# Patient Record
Sex: Male | Born: 1940 | Race: Black or African American | Hispanic: No | Marital: Married | Smoking: Former smoker
Health system: Southern US, Community
[De-identification: ages and names within clinical notes are randomized; demographics above are authoritative.]

## PROBLEM LIST (undated history)

## (undated) DIAGNOSIS — Z8673 Personal history of transient ischemic attack (TIA), and cerebral infarction without residual deficits: Secondary | ICD-10-CM

## (undated) DIAGNOSIS — G9389 Other specified disorders of brain: Secondary | ICD-10-CM

## (undated) DIAGNOSIS — F0390 Unspecified dementia without behavioral disturbance: Secondary | ICD-10-CM

## (undated) DIAGNOSIS — G5620 Lesion of ulnar nerve, unspecified upper limb: Secondary | ICD-10-CM

## (undated) DIAGNOSIS — G40209 Localization-related (focal) (partial) symptomatic epilepsy and epileptic syndromes with complex partial seizures, not intractable, without status epilepticus: Secondary | ICD-10-CM

## (undated) DIAGNOSIS — E785 Hyperlipidemia, unspecified: Secondary | ICD-10-CM

## (undated) HISTORY — PX: KNEE SURGERY: SHX244

## (undated) HISTORY — DX: Personal history of transient ischemic attack (TIA), and cerebral infarction without residual deficits: Z86.73

## (undated) HISTORY — DX: Hyperlipidemia, unspecified: E78.5

## (undated) HISTORY — PX: OTHER SURGICAL HISTORY: SHX169

## (undated) HISTORY — DX: Lesion of ulnar nerve, unspecified upper limb: G56.20

---

## 1968-05-18 HISTORY — PX: KNEE SURGERY: SHX244

## 2008-05-01 ENCOUNTER — Ambulatory Visit: Payer: Self-pay | Admitting: Cardiology

## 2008-05-01 LAB — CONVERTED CEMR LAB
BUN: 15 mg/dL (ref 6–23)
Bilirubin Urine: NEGATIVE
CO2: 29 meq/L (ref 19–32)
Calcium: 9.9 mg/dL (ref 8.4–10.5)
Chloride: 107 meq/L (ref 96–112)
Creatinine, Ser: 1.3 mg/dL (ref 0.4–1.5)
GFR calc Af Amer: 71 mL/min
GFR calc non Af Amer: 59 mL/min
Glucose, Bld: 96 mg/dL (ref 70–99)
Hemoglobin, Urine: NEGATIVE
Ketones, ur: NEGATIVE mg/dL
Leukocytes, UA: NEGATIVE
Nitrite: NEGATIVE
Potassium: 3.9 meq/L (ref 3.5–5.1)
Sodium: 141 meq/L (ref 135–145)
Specific Gravity, Urine: 1.02 (ref 1.000–1.03)
TSH: 3.92 microintl units/mL (ref 0.35–5.50)
Urine Glucose: NEGATIVE mg/dL
Urobilinogen, UA: 0.2 (ref 0.0–1.0)
pH: 6 (ref 5.0–8.0)

## 2008-05-15 ENCOUNTER — Ambulatory Visit: Payer: Self-pay

## 2008-05-15 ENCOUNTER — Ambulatory Visit: Payer: Self-pay | Admitting: Cardiology

## 2008-05-15 ENCOUNTER — Encounter: Payer: Self-pay | Admitting: Cardiology

## 2008-06-01 ENCOUNTER — Ambulatory Visit: Payer: Self-pay | Admitting: Cardiology

## 2008-06-05 ENCOUNTER — Ambulatory Visit: Payer: Self-pay | Admitting: Cardiology

## 2008-06-22 ENCOUNTER — Ambulatory Visit: Payer: Self-pay | Admitting: Cardiology

## 2008-06-29 ENCOUNTER — Ambulatory Visit: Payer: Self-pay | Admitting: Cardiology

## 2008-06-29 LAB — CONVERTED CEMR LAB
Calcium: 9.4 mg/dL (ref 8.4–10.5)
GFR calc Af Amer: 60 mL/min
Sodium: 139 meq/L (ref 135–145)

## 2008-07-18 ENCOUNTER — Ambulatory Visit: Payer: Self-pay | Admitting: Cardiology

## 2008-07-18 LAB — CONVERTED CEMR LAB
GFR calc Af Amer: 65 mL/min
GFR calc non Af Amer: 54 mL/min
Potassium: 4.5 meq/L (ref 3.5–5.1)
Sodium: 139 meq/L (ref 135–145)

## 2008-07-20 ENCOUNTER — Ambulatory Visit: Payer: Self-pay | Admitting: Cardiology

## 2008-07-31 DIAGNOSIS — I10 Essential (primary) hypertension: Secondary | ICD-10-CM

## 2008-08-01 ENCOUNTER — Ambulatory Visit: Payer: Self-pay | Admitting: Cardiology

## 2008-08-01 ENCOUNTER — Encounter: Payer: Self-pay | Admitting: Nurse Practitioner

## 2008-08-13 ENCOUNTER — Ambulatory Visit: Payer: Self-pay | Admitting: Cardiology

## 2008-08-28 ENCOUNTER — Ambulatory Visit: Payer: Self-pay | Admitting: Cardiology

## 2008-08-28 ENCOUNTER — Encounter: Payer: Self-pay | Admitting: Cardiology

## 2008-09-19 ENCOUNTER — Ambulatory Visit: Payer: Self-pay | Admitting: Cardiology

## 2008-09-20 ENCOUNTER — Inpatient Hospital Stay (HOSPITAL_COMMUNITY): Admission: EM | Admit: 2008-09-20 | Discharge: 2008-09-22 | Payer: Self-pay | Admitting: Emergency Medicine

## 2008-09-20 ENCOUNTER — Encounter (INDEPENDENT_AMBULATORY_CARE_PROVIDER_SITE_OTHER): Payer: Self-pay | Admitting: *Deleted

## 2008-09-20 ENCOUNTER — Encounter (INDEPENDENT_AMBULATORY_CARE_PROVIDER_SITE_OTHER): Payer: Self-pay | Admitting: Internal Medicine

## 2008-09-20 ENCOUNTER — Ambulatory Visit: Payer: Self-pay | Admitting: Vascular Surgery

## 2008-09-27 ENCOUNTER — Ambulatory Visit: Payer: Self-pay | Admitting: Cardiology

## 2008-09-27 DIAGNOSIS — G459 Transient cerebral ischemic attack, unspecified: Secondary | ICD-10-CM

## 2008-10-30 ENCOUNTER — Ambulatory Visit: Payer: Self-pay | Admitting: Cardiology

## 2008-11-16 ENCOUNTER — Ambulatory Visit: Payer: Self-pay | Admitting: Internal Medicine

## 2008-11-16 DIAGNOSIS — H26499 Other secondary cataract, unspecified eye: Secondary | ICD-10-CM | POA: Insufficient documentation

## 2008-11-16 DIAGNOSIS — R7309 Other abnormal glucose: Secondary | ICD-10-CM

## 2008-11-16 DIAGNOSIS — E785 Hyperlipidemia, unspecified: Secondary | ICD-10-CM

## 2008-11-16 DIAGNOSIS — N400 Enlarged prostate without lower urinary tract symptoms: Secondary | ICD-10-CM

## 2008-11-16 LAB — CONVERTED CEMR LAB
Albumin: 4 g/dL (ref 3.5–5.2)
Alkaline Phosphatase: 45 units/L (ref 39–117)
Basophils Absolute: 0 10*3/uL (ref 0.0–0.1)
Bilirubin Urine: NEGATIVE
CO2: 27 meq/L (ref 19–32)
Calcium: 9.6 mg/dL (ref 8.4–10.5)
Chloride: 110 meq/L (ref 96–112)
Creatinine, Ser: 1.3 mg/dL (ref 0.4–1.5)
Eosinophils Absolute: 0.2 10*3/uL (ref 0.0–0.7)
Glucose, Bld: 129 mg/dL — ABNORMAL HIGH (ref 70–99)
HDL: 36.6 mg/dL — ABNORMAL LOW (ref >39.00)
Hemoglobin, Urine: NEGATIVE
Hemoglobin: 13.1 g/dL (ref 13.0–17.0)
Hgb A1c MFr Bld: 5.2 % (ref 4.6–6.5)
Ketones, ur: NEGATIVE mg/dL
Leukocytes, UA: NEGATIVE
Lymphocytes Relative: 31.1 % (ref 12.0–46.0)
MCHC: 33.6 g/dL (ref 30.0–36.0)
MCV: 91.4 fL (ref 78.0–100.0)
Magnesium: 2.1 mg/dL (ref 1.5–2.5)
Monocytes Absolute: 1 10*3/uL (ref 0.1–1.0)
Neutro Abs: 3.1 10*3/uL (ref 1.4–7.7)
PSA: 0.55 ng/mL (ref 0.10–4.00)
RDW: 12 % (ref 11.5–14.6)
Sodium: 143 meq/L (ref 135–145)
Specific Gravity, Urine: 1.02 (ref 1.000–1.030)
TSH: 2.25 microintl units/mL (ref 0.35–5.50)
Total Protein: 6.7 g/dL (ref 6.0–8.3)
Triglycerides: 75 mg/dL (ref 0.0–149.0)
Urine Glucose: NEGATIVE mg/dL
Urobilinogen, UA: 0.2 (ref 0.0–1.0)

## 2008-12-18 ENCOUNTER — Ambulatory Visit: Payer: Self-pay | Admitting: Internal Medicine

## 2008-12-18 LAB — CONVERTED CEMR LAB: HDL goal, serum: 40 mg/dL

## 2008-12-25 ENCOUNTER — Ambulatory Visit: Payer: Self-pay | Admitting: Gastroenterology

## 2009-01-08 ENCOUNTER — Encounter: Payer: Self-pay | Admitting: Gastroenterology

## 2009-01-08 ENCOUNTER — Ambulatory Visit: Payer: Self-pay | Admitting: Gastroenterology

## 2009-01-09 ENCOUNTER — Encounter: Payer: Self-pay | Admitting: Gastroenterology

## 2009-01-22 ENCOUNTER — Ambulatory Visit: Payer: Self-pay | Admitting: Cardiology

## 2009-04-18 ENCOUNTER — Ambulatory Visit: Payer: Self-pay | Admitting: Internal Medicine

## 2009-07-18 ENCOUNTER — Ambulatory Visit: Payer: Self-pay | Admitting: Internal Medicine

## 2009-07-18 ENCOUNTER — Telehealth: Payer: Self-pay | Admitting: Internal Medicine

## 2009-07-18 DIAGNOSIS — E8881 Metabolic syndrome: Secondary | ICD-10-CM

## 2009-08-14 ENCOUNTER — Ambulatory Visit: Payer: Self-pay | Admitting: Cardiology

## 2009-08-22 ENCOUNTER — Ambulatory Visit: Payer: Self-pay | Admitting: Internal Medicine

## 2009-11-15 ENCOUNTER — Ambulatory Visit: Payer: Self-pay | Admitting: Internal Medicine

## 2009-11-15 LAB — CONVERTED CEMR LAB
CO2: 25 meq/L (ref 19–32)
Calcium: 9.6 mg/dL (ref 8.4–10.5)
Chloride: 109 meq/L (ref 96–112)
Glucose, Bld: 118 mg/dL — ABNORMAL HIGH (ref 70–99)
Potassium: 4.8 meq/L (ref 3.5–5.1)
Sodium: 140 meq/L (ref 135–145)

## 2010-05-20 ENCOUNTER — Encounter: Payer: Self-pay | Admitting: Internal Medicine

## 2010-05-20 ENCOUNTER — Ambulatory Visit
Admission: RE | Admit: 2010-05-20 | Discharge: 2010-05-20 | Payer: Self-pay | Source: Home / Self Care | Attending: Internal Medicine | Admitting: Internal Medicine

## 2010-05-20 LAB — CONVERTED CEMR LAB
BUN: 24 mg/dL — ABNORMAL HIGH (ref 6–23)
Basophils Absolute: 0 10*3/uL (ref 0.0–0.1)
Bilirubin, Direct: 0.1 mg/dL (ref 0.0–0.3)
Blood, UA: NEGATIVE
Chloride: 107 meq/L (ref 96–112)
Cholesterol: 158 mg/dL (ref 0–200)
Creatinine, Ser: 1.3 mg/dL (ref 0.4–1.5)
Eosinophils Relative: 4.6 % (ref 0.0–5.0)
GFR calc non Af Amer: 70.46 mL/min (ref >60.00)
Glucose, Bld: 120 mg/dL — ABNORMAL HIGH (ref 70–99)
HCT: 39.9 % (ref 39.0–52.0)
Hgb A1c MFr Bld: 6.4 % (ref 4.6–6.5)
LDL Cholesterol: 101 mg/dL — ABNORMAL HIGH (ref 0–99)
Lymphs Abs: 2.4 10*3/uL (ref 0.7–4.0)
MCV: 93.6 fL (ref 78.0–100.0)
Monocytes Absolute: 1.1 10*3/uL — ABNORMAL HIGH (ref 0.1–1.0)
Neutrophils Relative %: 48.6 % (ref 43.0–77.0)
Nitrite: NEGATIVE
PSA: 0.77 ng/mL (ref 0.10–4.00)
Platelets: 190 10*3/uL (ref 150.0–400.0)
Potassium: 4.6 meq/L (ref 3.5–5.1)
RDW: 12.5 % (ref 11.5–14.6)
Specific Gravity, Urine: 1.015 (ref 1.000–1.030)
TSH: 2.15 microintl units/mL (ref 0.35–5.50)
Total Bilirubin: 0.9 mg/dL (ref 0.3–1.2)
Triglycerides: 129 mg/dL (ref 0.0–149.0)
Urobilinogen, UA: 0.2 (ref 0.0–1.0)
VLDL: 25.8 mg/dL (ref 0.0–40.0)
WBC: 7.5 10*3/uL (ref 4.5–10.5)

## 2010-05-21 ENCOUNTER — Telehealth: Payer: Self-pay | Admitting: Internal Medicine

## 2010-05-21 ENCOUNTER — Encounter: Payer: Self-pay | Admitting: Internal Medicine
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
## 2010-06-17 NOTE — Progress Notes (Signed)
     Follow-up for Phone Call       Follow-up by: Etta Grandchild MD,  July 18, 2009 10:40 AM    Additional Follow-up for Phone Call Additional follow up Details #2::    please sign out of his chart Follow-up by: Etta Grandchild MD,  July 18, 2009 10:40 AM
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
## 2010-06-18 ENCOUNTER — Ambulatory Visit (INDEPENDENT_AMBULATORY_CARE_PROVIDER_SITE_OTHER): Payer: Medicare Other | Admitting: Internal Medicine

## 2010-06-18 ENCOUNTER — Encounter: Payer: Self-pay | Admitting: Internal Medicine

## 2010-06-18 DIAGNOSIS — I10 Essential (primary) hypertension: Secondary | ICD-10-CM

## 2010-06-18 DIAGNOSIS — N410 Acute prostatitis: Secondary | ICD-10-CM

## 2010-06-18 DIAGNOSIS — E1165 Type 2 diabetes mellitus with hyperglycemia: Secondary | ICD-10-CM

## 2010-06-18 DIAGNOSIS — E7800 Pure hypercholesterolemia, unspecified: Secondary | ICD-10-CM

## 2010-06-18 DIAGNOSIS — E119 Type 2 diabetes mellitus without complications: Secondary | ICD-10-CM
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
## 2010-06-19 NOTE — Progress Notes (Signed)
  Phone Note Outgoing Call   Summary of Call: LA - please tell him that it looks like he has a prostate gland infection, I sent an Rx in for cipro Initial call taken by: Etta Grandchild MD,  May 21, 2010 7:24 AM  Follow-up for Phone Call        Patient wife notified and he will pick up rx and recheck in 30 days.Alvy Beal Archie CMA  May 21, 2010 8:46 AM     New/Updated Medications: CIPRO 500 MG TAB (CIPROFLOXACIN HCL) Take 1 tablet by mouth morning and night X 30 days Prescriptions: CIPRO 500 MG TAB (CIPROFLOXACIN HCL) Take 1 tablet by mouth morning and night X 30 days  #60 x 1   Entered and Authorized by:   Etta Grandchild MD   Signed by:   Etta Grandchild MD on 05/21/2010   Method used:   Electronically to        [facility] [phone]* (retail)       [address]       [place], [place]  [address]       Ph: [phone]       Fax: [phone]   RxID:   [id number]

## 2010-06-19 NOTE — Letter (Signed)
Summary: Lipid Letter  [facility]  [address] [place], [place] [address]   Phone: [phone]  Fax: [phone]    05/21/2010  Tony [name] [address] [place], [place]  [address]  Dear Tony [name]:  We have carefully reviewed your last lipid profile from 05/20/2010 and the results are noted below with a summary of recommendations for lipid management.    Cholesterol:       158     Goal: <200   HDL "good" Cholesterol:   75.64     Goal: >40   LDL "bad" Cholesterol:   101     Goal: <70   Triglycerides:       129.0     Goal: <150    your other labs look good with the exception of abnormal urine that looks like a prostate gland infection - I have sent an anitbiotic prescription with this letter    TLC Diet (Therapeutic Lifestyle Change): Saturated Fats & Transfatty acids should be kept < 7% of total calories ***Reduce Saturated Fats Polyunstaurated Fat can be up to 10% of total calories Monounsaturated Fat Fat can be up to 20% of total calories Total Fat should be no greater than 25-35% of total calories Carbohydrates should be 50-60% of total calories Protein should be approximately 15% of total calories Fiber should be at least 20-30 grams a day ***Increased fiber may help lower LDL Total Cholesterol should be < 200mg /day Consider adding plant stanol/sterols to diet (example: Benacol spread) ***A higher intake of unsaturated fat may reduce Triglycerides and Increase HDL    Adjunctive Measures (may lower LIPIDS and reduce risk of Heart Attack) include: Aerobic Exercise (20-30 minutes 3-4 times a week) Limit Alcohol Consumption Weight Reduction Aspirin 75-81 mg a day by mouth (if not allergic or contraindicated) Dietary Fiber 20-30 grams a day by mouth     Current Medications: 1)    Metoprolol Succinate 100 Mg Xr24h-tab (Metoprolol succinate) .... One by mouth daily 2)    Spironolactone 50 Mg Tabs (Spironolactone) .... One by mouth daily 3)    Aspirin  325 Mg Tabs (Aspirin) .... One by mouth daily 4)    Multivitamins   Tabs (Multiple vitamin) .... Daily 5)    Zocor 10 Mg Tabs (Simvastatin) .... Daily 6)    Valturna 300-320 Mg Tabs (Aliskiren-valsartan) .... One by mouth once daily for high blood pressure  If you have any questions, please call. We appreciate being able to work with you.   Sincerely,    [facility] Etta Grandchild MD
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
## 2010-07-15 ENCOUNTER — Encounter: Payer: Self-pay | Admitting: Internal Medicine

## 2010-07-28 ENCOUNTER — Telehealth: Payer: Self-pay | Admitting: Internal Medicine

## 2010-08-05 NOTE — Progress Notes (Signed)
     Diabetes Management Exam:    Eye Exam:       Eye Exam done elsewhere          Date: 07/15/2010          Results: normal          Done by: Clarisa Kindred

## 2010-08-05 NOTE — Consult Note (Signed)
Summary: [facility]   Imported By: Sherian Rein 07/30/2010 12:50:55  _____________________________________________________________________  External Attachment:    Type:   Image     Comment:   External Document

## 2010-08-21 ENCOUNTER — Encounter: Payer: Self-pay | Admitting: Internal Medicine

## 2010-08-21 ENCOUNTER — Ambulatory Visit (INDEPENDENT_AMBULATORY_CARE_PROVIDER_SITE_OTHER): Payer: Medicare Other | Admitting: Internal Medicine

## 2010-08-21 ENCOUNTER — Telehealth: Payer: Self-pay | Admitting: *Deleted

## 2010-08-21 VITALS — BP 128/70 | HR 56 | Temp 98.5°F | Ht 69.0 in | Wt 193.0 lb

## 2010-08-21 DIAGNOSIS — K648 Other hemorrhoids: Secondary | ICD-10-CM | POA: Insufficient documentation

## 2010-08-21 DIAGNOSIS — I10 Essential (primary) hypertension: Secondary | ICD-10-CM

## 2010-08-21 MED ORDER — HYDROCORTISONE ACE-PRAMOXINE 1-1 % RE FOAM: 1.0000 | Freq: Two times a day (BID) | RECTAL | Status: AC

## 2010-08-21 NOTE — Progress Notes (Signed)
  Subjective:    Patient ID: Tony [name], male    DOB: Jul 25, 1940, 70 y.o.   MRN: [id number]  HPI He returns c/o several weeks of anal pain and itching but no bleeding.   Review of Systems  Constitutional: Negative for fever, chills, diaphoresis, activity change, appetite change, fatigue and unexpected weight change.  Respiratory: Negative for cough, shortness of breath, wheezing and stridor.   Cardiovascular: Negative for chest pain, palpitations and leg swelling.  Gastrointestinal: Negative for nausea, vomiting, abdominal pain, diarrhea, constipation, blood in stool, abdominal distention and anal bleeding.  Genitourinary: Negative for dysuria, urgency, frequency, hematuria, flank pain, decreased urine volume, discharge, penile swelling, scrotal swelling, difficulty urinating, genital sores, penile pain and testicular pain.  Musculoskeletal: Negative for myalgias, back pain, joint swelling, arthralgias and gait problem.  Neurological: Negative for dizziness, numbness and headaches.  Hematological: Negative for adenopathy. Does not bruise/bleed easily.  Psychiatric/Behavioral: Negative for behavioral problems, confusion and agitation.       Lab Results  Component Value Date   WBC 7.5 05/20/2010   HGB 13.4 05/20/2010   HCT 39.9 05/20/2010   PLT 190.0 05/20/2010   CHOL 158 05/20/2010   TRIG 129.0 05/20/2010   HDL 31.10* 05/20/2010   ALT 32 05/20/2010   AST 26 05/20/2010   NA 138 05/20/2010   K 4.6 05/20/2010   CL 107 05/20/2010   CREATININE 1.3 05/20/2010   BUN 24* 05/20/2010   CO2 22 05/20/2010   TSH 2.15 05/20/2010   PSA 0.77 05/20/2010   HGBA1C 6.4 05/20/2010   Objective:   Physical Exam  Constitutional: He appears well-developed and well-nourished. No distress.  HENT:  Head: Normocephalic and atraumatic.  Right Ear: External ear normal.  Left Ear: External ear normal.  Nose: Nose normal.  Mouth/Throat: Oropharynx is clear and moist. No oropharyngeal exudate.  Eyes: Conjunctivae and EOM are normal.  Pupils are equal, round, and reactive to light. Right eye exhibits no discharge. Left eye exhibits no discharge. No scleral icterus.  Neck: Normal range of motion. Neck supple. No thyromegaly present.  Cardiovascular: Normal rate, regular rhythm, normal heart sounds and intact distal pulses.  Exam reveals no gallop and no friction rub.   No murmur heard. Pulmonary/Chest: Effort normal and breath sounds normal. No respiratory distress. He has no wheezes. He has no rales. He exhibits no tenderness.  Abdominal: Soft. Bowel sounds are normal. He exhibits no distension and no mass. There is no tenderness. There is no rebound and no guarding.  Genitourinary: Prostate normal and penis normal. Rectal exam shows internal hemorrhoid. Rectal exam shows no external hemorrhoid, no fissure, no mass, no tenderness and anal tone normal. Guaiac negative stool. No penile tenderness.  Musculoskeletal: Normal range of motion. He exhibits no edema and no tenderness.  Lymphadenopathy:    He has no cervical adenopathy.  Neurological: He is alert.  Skin: Skin is warm and dry. No rash noted. He is not diaphoretic. No erythema. No pallor.  Psychiatric: He has a normal mood and affect. His behavior is normal. Judgment and thought content normal.          Assessment & Plan:

## 2010-08-21 NOTE — Assessment & Plan Note (Signed)
Start proctofoam and gave pt ed material as well

## 2010-08-21 NOTE — Telephone Encounter (Signed)
Pharm called req alt to to proctofoam. Med is not covered by INS and cost to pt is >80 dollars. Please send new rx to pharm

## 2010-08-21 NOTE — Assessment & Plan Note (Signed)
BP is well controlled, continue same

## 2010-08-26 LAB — CBC
HCT: 37.2 % — ABNORMAL LOW (ref 39.0–52.0)
HCT: 37.4 % — ABNORMAL LOW (ref 39.0–52.0)
MCHC: 34 g/dL (ref 30.0–36.0)
MCV: 91.1 fL (ref 78.0–100.0)
MCV: 92.3 fL (ref 78.0–100.0)
Platelets: 211 10*3/uL (ref 150–400)
Platelets: 212 10*3/uL (ref 150–400)
RDW: 13.2 % (ref 11.5–15.5)
RDW: 13.4 % (ref 11.5–15.5)
WBC: 8.9 10*3/uL (ref 4.0–10.5)

## 2010-08-26 LAB — COMPREHENSIVE METABOLIC PANEL
Albumin: 3.7 g/dL (ref 3.5–5.2)
Albumin: 3.9 g/dL (ref 3.5–5.2)
BUN: 21 mg/dL (ref 6–23)
BUN: 23 mg/dL (ref 6–23)
Calcium: 9.2 mg/dL (ref 8.4–10.5)
Calcium: 9.6 mg/dL (ref 8.4–10.5)
Chloride: 110 mEq/L (ref 96–112)
Creatinine, Ser: 1.44 mg/dL (ref 0.4–1.5)
Creatinine, Ser: 1.7 mg/dL — ABNORMAL HIGH (ref 0.4–1.5)
Total Bilirubin: 0.7 mg/dL (ref 0.3–1.2)
Total Protein: 6.3 g/dL (ref 6.0–8.3)

## 2010-08-26 LAB — DIFFERENTIAL
Basophils Absolute: 0 10*3/uL (ref 0.0–0.1)
Basophils Absolute: 0 10*3/uL (ref 0.0–0.1)
Basophils Relative: 0 % (ref 0–1)
Eosinophils Absolute: 0.1 10*3/uL (ref 0.0–0.7)
Eosinophils Relative: 1 % (ref 0–5)
Lymphocytes Relative: 17 % (ref 12–46)
Lymphocytes Relative: 19 % (ref 12–46)
Lymphs Abs: 1.5 10*3/uL (ref 0.7–4.0)
Lymphs Abs: 1.8 10*3/uL (ref 0.7–4.0)
Monocytes Absolute: 1.2 10*3/uL — ABNORMAL HIGH (ref 0.1–1.0)
Monocytes Absolute: 1.2 10*3/uL — ABNORMAL HIGH (ref 0.1–1.0)
Monocytes Relative: 13 % — ABNORMAL HIGH (ref 3–12)
Neutro Abs: 6 10*3/uL (ref 1.7–7.7)
Neutro Abs: 6.3 10*3/uL (ref 1.7–7.7)
Neutrophils Relative %: 67 % (ref 43–77)

## 2010-08-26 LAB — BASIC METABOLIC PANEL
BUN: 17 mg/dL (ref 6–23)
Chloride: 112 mEq/L (ref 96–112)
Creatinine, Ser: 1.28 mg/dL (ref 0.4–1.5)
Glucose, Bld: 116 mg/dL — ABNORMAL HIGH (ref 70–99)
Potassium: 4.2 mEq/L (ref 3.5–5.1)

## 2010-08-26 LAB — LIPID PANEL
LDL Cholesterol: 134 mg/dL — ABNORMAL HIGH (ref 0–99)
Total CHOL/HDL Ratio: 5.4 RATIO
Triglycerides: 118 mg/dL (ref <150)
VLDL: 24 mg/dL (ref 0–40)

## 2010-08-26 LAB — APTT: aPTT: 32 seconds (ref 24–37)

## 2010-08-26 LAB — PHOSPHORUS: Phosphorus: 3.5 mg/dL (ref 2.3–4.6)

## 2010-08-26 LAB — PROTIME-INR
INR: 1.2 (ref 0.00–1.49)
Prothrombin Time: 15.4 seconds — ABNORMAL HIGH (ref 11.6–15.2)

## 2010-08-26 LAB — CREATININE, URINE, RANDOM: Creatinine, Urine: 82.3 mg/dL

## 2010-08-26 LAB — HEMOGLOBIN A1C: Hgb A1c MFr Bld: 5.4 % (ref 4.6–6.1)

## 2010-08-26 LAB — MAGNESIUM: Magnesium: 2.3 mg/dL (ref 1.5–2.5)

## 2010-08-26 LAB — POCT CARDIAC MARKERS

## 2010-08-27 MED ORDER — HYDROCORTISONE 2.5 % RE CREA: TOPICAL_CREAM | Freq: Two times a day (BID) | RECTAL | Status: AC

## 2010-08-27 NOTE — Telephone Encounter (Signed)
done

## 2010-09-14 ENCOUNTER — Other Ambulatory Visit: Payer: Self-pay | Admitting: Cardiology

## 2010-09-18 ENCOUNTER — Ambulatory Visit: Payer: Medicare Other | Admitting: Cardiology

## 2010-09-19 ENCOUNTER — Observation Stay (HOSPITAL_COMMUNITY)
Admission: EM | Admit: 2010-09-19 | Discharge: 2010-09-21 | Disposition: A | Discharge status: Home / Self Care | Payer: Medicare Other | Source: Ambulatory Visit | Attending: Internal Medicine | Admitting: Internal Medicine

## 2010-09-19 DIAGNOSIS — E669 Obesity, unspecified: Secondary | ICD-10-CM | POA: Insufficient documentation

## 2010-09-19 DIAGNOSIS — Z79899 Other long term (current) drug therapy: Secondary | ICD-10-CM | POA: Insufficient documentation

## 2010-09-19 DIAGNOSIS — I498 Other specified cardiac arrhythmias: Secondary | ICD-10-CM | POA: Insufficient documentation

## 2010-09-19 DIAGNOSIS — I119 Hypertensive heart disease without heart failure: Principal | ICD-10-CM | POA: Insufficient documentation

## 2010-09-19 DIAGNOSIS — Z8673 Personal history of transient ischemic attack (TIA), and cerebral infarction without residual deficits: Secondary | ICD-10-CM | POA: Insufficient documentation

## 2010-09-19 DIAGNOSIS — E860 Dehydration: Secondary | ICD-10-CM | POA: Insufficient documentation

## 2010-09-19 DIAGNOSIS — N289 Disorder of kidney and ureter, unspecified: Secondary | ICD-10-CM | POA: Insufficient documentation

## 2010-09-19 DIAGNOSIS — E785 Hyperlipidemia, unspecified: Secondary | ICD-10-CM | POA: Insufficient documentation

## 2010-09-19 LAB — POCT I-STAT, CHEM 8
BUN: 41 mg/dL — ABNORMAL HIGH (ref 6–23)
Calcium, Ion: 1.17 mmol/L (ref 1.12–1.32)
Creatinine, Ser: 3.3 mg/dL — ABNORMAL HIGH (ref 0.4–1.5)
Glucose, Bld: 100 mg/dL — ABNORMAL HIGH (ref 70–99)
TCO2: 17 mmol/L (ref 0–100)

## 2010-09-19 LAB — ETHANOL: Alcohol, Ethyl (B): <11 mg/dL — ABNORMAL HIGH (ref 0–10)

## 2010-09-20 LAB — CARDIAC PANEL(CRET KIN+CKTOT+MB+TROPI)
CK, MB: 7.1 ng/mL (ref 0.3–4.0)
Relative Index: 0.8 (ref 0.0–2.5)
Relative Index: 0.8 (ref 0.0–2.5)
Total CK: 885 U/L — ABNORMAL HIGH (ref 7–232)
Troponin I: <0.3 ng/mL (ref <0.30)

## 2010-09-21 LAB — BASIC METABOLIC PANEL
CO2: 21 mEq/L (ref 19–32)
Chloride: 112 mEq/L (ref 96–112)
GFR calc non Af Amer: >60 mL/min (ref >60)
Glucose, Bld: 135 mg/dL — ABNORMAL HIGH (ref 70–99)
Potassium: 4.1 mEq/L (ref 3.5–5.1)
Sodium: 139 mEq/L (ref 135–145)

## 2010-09-22 NOTE — H&P (Signed)
NAMEMARCO, [name] NO.:  [id number]  MEDICAL RECORD NO.:  [id number]           PATIENT TYPE:  E  LOCATION:  [facility]                         FACILITY:  [facility]  PHYSICIAN:  Houston Siren, MD           DATE OF BIRTH:  05/06/1941  DATE OF ADMISSION:  09/19/2010 DATE OF DISCHARGE:                             HISTORY & PHYSICAL   PRIMARY CARE PHYSICIAN:  None.  CARDIOLOGIST:  Rollene Rotunda, MD, [facility]  REASON FOR ADMISSION:  Syncope.  ADVANCED DIRECTIVE:  Full code.  HISTORY OF PRESENT ILLNESS:  This is a 70 year old male with history of hypertension on beta-blocker, valsartan, Norvasc, and diuretics, prior CVA, presented to the emergency room with a brief presyncopal episode.  His wife saw that he stood up and passed out.  He stated he has some orthostatic symptomatology prior to this.  He denied any chest pain, shortness of breath, headache, nausea, vomiting, or diaphoresis.  He has had no fever or chills, black stool, or bloody stool.  Apparently, he has not eaten anything all day today.  He had an episode prior with syncopal episode and was thought to be secondary to bradycardia at that time.  Evaluation in the emergency room showed elevated CPK of 1222. His troponin was less than 0.3, creatinine of 3.0, potassium of 5.3. His alcohol level is less than 11.  EKG showed normal sinus rhythm without any acute ST-T changes.  Hospitalist was asked to admit the patient because of syncopal episode.  PAST MEDICAL HISTORY:  As above.  ALLERGIES:  No known drug allergies.  CURRENT MEDICATIONS: 1. Lumigan eye drop. 2. Exforge 5/320 1 per day. 3. Simvastatin 40 mg per day. 4. Multivitamin. 5. Aspirin 325 mg per day. 6. Spirolactone 50 mg daily. 7. Lopressor 100 mg per day. 8. Systane eyedrops.  REVIEW OF SYSTEMS:  Otherwise unremarkable.  SOCIAL HISTORY:  He is married, lives with his spouse, not a smoker and only an occasional drinker.  PHYSICAL EXAMINATION:   VITAL SIGNS:  Blood pressure 110-120/60, pulse of 76, respiratory rate of 18, temperature 98.5. GENERAL:  He is alert and oriented, and he in no apparent distress. HEENT:  He has facial asymmetry and fluent speech.  Tongue is midline. Uvula elevated with phonation.  No carotid bruit. CARDIAC:  S1 and S2 regular.  I did not hear murmur, rub, or gallop. LUNGS:  Clear.  No wheezes, rales, or any evidence of consolidation. ABDOMEN:  Soft, nondistended, nontender.  No palpable mass. EXTREMITIES:  No edema.  Babinski on flexion.  Strength equal bilaterally. NEUROLOGIC/PSYCHIATRIC:  Normal.  OBJECTIVE FINDINGS:  Troponin less than 0.3, alcohol less than 11.  CPK of 1222, potassium of 5.3, BUN of 41, creatinine of 3.0, serum sodium of 138.  IMPRESSION:  This is a 70 year old male with prior severe labile hypertension on beta-blocker and several other antihypertensive medications presenting with a syncopal episode.  I suspect that he was orthostatic from dehydration and having bradycardia as well.  We will admit to monitor his rhythm.  Rule out with serial CPKs and troponins.  I would like to  discontinue his spinorolactone and hold his antihypertensive medications including the beta blocker.  He is a full code.  He will be [facility] to [facility].  Undoubtedly, his blood pressure will go up, and his blood pressure medication will need to be resumed as appropriate.  He is stable, a full code.     Houston Siren, MD     PL/MEDQ  D:  09/20/2010  T:  09/20/2010  Job:  [id number]  Electronically Signed by Houston Siren  on 09/22/2010 04:41:55 AM

## 2010-09-23 ENCOUNTER — Encounter: Payer: Self-pay | Admitting: Internal Medicine

## 2010-09-24 NOTE — Discharge Summary (Signed)
Tony [name], Tony [name]               ACCOUNT NO.:  [id number]  MEDICAL RECORD NO.:  [id number]           PATIENT TYPE:  LOCATION:                                 FACILITY:  PHYSICIAN:  Pleas Koch, MD        DATE OF BIRTH:  08/14/1940  DATE OF ADMISSION:  09/20/2010 DATE OF DISCHARGE:  09/21/2010                              DISCHARGE SUMMARY   DISCHARGE DIAGNOSES: 1. Labile hypertension, on multiple medications. 2. Orthostasis from dehydration as well as some mild bradycardia. 3. Hyperlipidemia. 4. History of transient ischemic attack in the past. 5. Mild obesity.  DISCHARGE MEDICATIONS: 1. Exforge 5/320 1 tablet daily. 2. Metoprolol XL succinate, please note dosage changed from 100 mg     daily to 25 mg as extended release, 30 tablets prescribed. 3. Simvastatin 40 mg daily. 4. Spironolactone, please note dosage changed from 50 mg once daily to     25 mg once daily, 30 tablets given. 5. Multivitamins over-the-counter. 6. Aspirin 325 daily. 7. Lumigan 0.03% at bedtime. 8. Systane eye drops 1 drop b.i.d. p.r.n.  HOSPITAL COURSE:  Briefly, this is a 70 year old male who has a history of hypertension, on multiple diuretics; prior CVA, who presented with brief syncopal episode.  His wife told that he stood up and then passed out.  He stated he had some orthostatic symptomatology.  Denied any chest pain, shortness of breath, headache, nausea, vomiting, weakness. No fevers, chills, black stool, bloody stools.  Not eaten anything all day and was working in the yard, mowing the lawn and had not drank anything either.  On admission, his creatinine was 3.0 and his pulse was around 76.  He had good facial symmetry.  No carotid bruit.  No murmurs, rubs, or gallops.  Equal strength bilaterally.  Blood pressure well controlled.  DISCHARGE DIAGNOSES: 1. Relative orthostasis, likely secondary to multiple diuretics as     well as volume depletion from not taking p.o. - the patient was   held off his multiple antihypertensive medications.  It was noted     that his blood pressure then rebounded upwards.  As such, we have     reimplemented his metoprolol and spironolactone with the dosages as     dictated above.  The patient should follow with his primary care     physician in 4-5 days to determine best course of action.  It is     unclear why he is on specifically spironolactone and he does not     have any history of heart failure at this time and this may be     useful to consider on discharge home. 2. Acute renal insufficiency.  Initial creatinine was 3.  It was noted     that his creatinine on day of discharge dropped to 1.16     BUN was also 41   I     would attribute it to be prerenal azotemia.  The patient will need     a BMP in about 1-2 days and to check for both this and his     potassium as he is on  spironolactone.  He is also on valsartan,     which relatively is contraindicated, but as his renal function is     improving, we will continue the same. 3. Hyperlipidemia.  The patient is to continue statin. 4. Prior transient ischemic attack.  The patient had no noted symptoms     of this complain and will continue on aspirin 325 daily.  The patient was discharged home in stable state.  PHYSICAL EXAMINATION:  VITAL SIGNS:  On day of discharge, temperature 97.4, pulse was 59, respirations were 19, blood pressure was 143/77 to 126/65, and satting 99% on room air. CHEST:  Clinically clear. ABDOMEN:  Soft, nontender, and nondistended. CARDIOVASCULAR:  S1 and S2.  No noted murmurs, rubs, or gallops.  The patient will need to follow up with his primary care physician as well.  We will follow up with him in 4-5 days.          ______________________________ Pleas Koch, MD     JS/MEDQ  D:  09/21/2010  T:  09/21/2010  Job:  [id number]  cc:   Rollene Rotunda, MD, [facility] Sanda Linger, MD  Electronically Signed by Pleas Koch MD on 09/24/2010 08:17:15 PM

## 2010-09-25 ENCOUNTER — Other Ambulatory Visit: Payer: Self-pay | Admitting: Cardiology

## 2010-09-25 ENCOUNTER — Ambulatory Visit (INDEPENDENT_AMBULATORY_CARE_PROVIDER_SITE_OTHER): Payer: Medicare Other | Admitting: Internal Medicine

## 2010-09-25 ENCOUNTER — Encounter: Payer: Self-pay | Admitting: Internal Medicine

## 2010-09-25 VITALS — BP 130/74 | HR 60 | Temp 98.8°F | Resp 16 | Wt 190.0 lb

## 2010-09-25 DIAGNOSIS — I10 Essential (primary) hypertension: Secondary | ICD-10-CM

## 2010-09-25 MED ORDER — AMLODIPINE BESYLATE-VALSARTAN 5-320 MG PO TABS: 1.0000 | ORAL_TABLET | Freq: Every day | ORAL | Status: DC

## 2010-09-25 NOTE — Assessment & Plan Note (Signed)
His BP is well controlled, his last set of lytes and renal function was normal so I did not repeat them today

## 2010-09-25 NOTE — Progress Notes (Signed)
Subjective:    Patient ID: Tony [name], male    DOB: 08/21/1940, 70 y.o.   MRN: [id number]  Hypertension This is a chronic problem. The current episode started more than 1 year ago. The problem has been gradually improving since onset. The problem is controlled. Pertinent negatives include no anxiety, blurred vision, chest pain, headaches, malaise/fatigue, neck pain, orthopnea, palpitations, peripheral edema, PND, shortness of breath or sweats. There are no associated agents to hypertension. Past treatments include calcium channel blockers, beta blockers, angiotensin blockers and diuretics. The current treatment provides significant improvement. Compliance problems include medication side effects.       Review of Systems  Constitutional: Negative for fever, chills, malaise/fatigue, diaphoresis, activity change, appetite change, fatigue and unexpected weight change.  HENT: Negative for sore throat, facial swelling, trouble swallowing, neck pain, neck stiffness and voice change.   Eyes: Negative for blurred vision.  Respiratory: Negative for apnea, cough, choking, chest tightness, shortness of breath, wheezing and stridor.   Cardiovascular: Negative for chest pain, palpitations, orthopnea, leg swelling and PND.  Gastrointestinal: Negative for nausea, vomiting, abdominal pain, diarrhea, constipation, blood in stool, abdominal distention and anal bleeding.  Genitourinary: Negative for dysuria, urgency, frequency, hematuria, decreased urine volume, enuresis and difficulty urinating.  Musculoskeletal: Negative for myalgias, back pain, joint swelling, arthralgias and gait problem.  Skin: Negative for color change, pallor and rash.  Neurological: Positive for syncope (he got overheated one week ago and passed out and was in the hosp. for 2 days, he was a lillte dehydrrated and his meds were adjusted). Negative for dizziness, tremors, seizures, facial asymmetry, speech difficulty, weakness,  light-headedness, numbness and headaches.  Hematological: Negative for adenopathy. Does not bruise/bleed easily.  Psychiatric/Behavioral: Negative for suicidal ideas, hallucinations, behavioral problems, confusion, sleep disturbance, self-injury, dysphoric mood, decreased concentration and agitation. The patient is not nervous/anxious and is not hyperactive.        Objective:   Physical Exam  Vitals reviewed. Constitutional: He is oriented to person, place, and time. He appears well-developed and well-nourished. No distress.  HENT:  Head: Normocephalic and atraumatic.  Right Ear: External ear normal.  Left Ear: External ear normal.  Nose: Nose normal.  Mouth/Throat: Oropharynx is clear and moist. No oropharyngeal exudate.  Eyes: Conjunctivae and EOM are normal. Pupils are equal, round, and reactive to light. Right eye exhibits no discharge. Left eye exhibits no discharge. No scleral icterus.  Neck: Normal range of motion. Neck supple. No JVD present. No tracheal deviation present. No thyromegaly present.  Cardiovascular: Normal rate, regular rhythm, normal heart sounds and intact distal pulses.  Exam reveals no gallop and no friction rub.   No murmur heard. Pulmonary/Chest: Effort normal and breath sounds normal. No stridor. No respiratory distress. He has no wheezes. He has no rales. He exhibits no tenderness.  Abdominal: Soft. Bowel sounds are normal. He exhibits no distension and no mass. There is no tenderness. There is no rebound and no guarding.  Musculoskeletal: Normal range of motion. He exhibits no edema and no tenderness.  Lymphadenopathy:    He has no cervical adenopathy.  Neurological: He is alert and oriented to person, place, and time. He has normal reflexes. He displays normal reflexes. No cranial nerve deficit. He exhibits normal muscle tone. Coordination normal.  Skin: Skin is warm and dry. No rash noted. He is not diaphoretic. No erythema. No pallor.  Psychiatric: He has  a normal mood and affect. His behavior is normal. Judgment and thought content normal.  Lab Results  Component Value Date   WBC 7.5 05/20/2010   HGB 14.3 09/19/2010   HCT 42.0 09/19/2010   PLT 190.0 05/20/2010   CHOL 158 05/20/2010   TRIG 129.0 05/20/2010   HDL 31.10* 05/20/2010   ALT 32 05/20/2010   AST 26 05/20/2010   NA 139 09/21/2010   K 4.1 09/21/2010   CL 112 09/21/2010   CREATININE 1.16 DELTA CHECK NOTED 09/21/2010   BUN 18 09/21/2010   CO2 21 09/21/2010   TSH 1.319 09/20/2010   PSA 0.77 05/20/2010   INR 1.2 09/20/2008   HGBA1C 6.4 05/20/2010    Assessment & Plan:

## 2010-09-25 NOTE — Patient Instructions (Signed)
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
## 2010-09-30 NOTE — Procedures (Signed)
REFERRING PHYSICIAN:  Dr. Allena Katz.   CLINICAL HISTORY:  This is a portable EEG done with photic stimulation  and hyperventilation.  The patient is described as awake and alert.  This is a 70 year old man admitted after an episode of transient  alteration of consciousness.  EEG is for evaluation.   DESCRIPTION:  The dominant rhythm in this tracing is a moderate  amplitude alpha rhythm of 10-11 Hz.  A moderate to high amplitude alpha  rhythm of 10-11 Hz which dominates posteriorly, appears without abnormal  asymmetry, and attenuates with eye opening and closing.  Low amplitude  fast activity is seen frontally and centrally and appears without  abnormal asymmetry.  No focal slowing is noted and no epileptiform  discharges seen.  The patient remained in the awake state throughout the  recording.  Photic stimulation produced symmetric driving responses.  Hyperventilation produced no significant change in the background  rhythms.  Single channel devoted EKG revealed sinus bradycardia  throughout with a rate of approximately 54 beats per minute.   CONCLUSIONS:  Normal study in awake state.  Incidental note is made of  sinus bradycardia, the finding which may be relevant in the patient who  presents with a possible syncopal episode.      Michael L. Thad Ranger, M.D.  Electronically Signed     ZOX:[id number]  D:  09/21/2008 22:14:00  T:  09/22/2008 07:05:42  Job #:  [id number]
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
## 2010-10-01 ENCOUNTER — Telehealth: Payer: Self-pay | Admitting: *Deleted

## 2010-10-01 DIAGNOSIS — I10 Essential (primary) hypertension: Secondary | ICD-10-CM
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
## 2010-10-05 MED ORDER — AMLODIPINE BESYLATE 5 MG PO TABS: 5.0000 mg | ORAL_TABLET | Freq: Every day | ORAL | Status: DC

## 2010-10-05 MED ORDER — IRBESARTAN 300 MG PO TABS: 300.0000 mg | ORAL_TABLET | Freq: Every day | ORAL | Status: DC

## 2010-10-05 NOTE — Telephone Encounter (Signed)
done

## 2010-10-06 ENCOUNTER — Encounter: Payer: Self-pay | Admitting: Cardiology

## 2010-10-06 ENCOUNTER — Ambulatory Visit (INDEPENDENT_AMBULATORY_CARE_PROVIDER_SITE_OTHER): Payer: Medicare Other | Admitting: Cardiology

## 2010-10-06 DIAGNOSIS — E7800 Pure hypercholesterolemia, unspecified: Secondary | ICD-10-CM

## 2010-10-06 DIAGNOSIS — I10 Essential (primary) hypertension: Secondary | ICD-10-CM

## 2010-10-06 DIAGNOSIS — R55 Syncope and collapse: Secondary | ICD-10-CM | POA: Insufficient documentation

## 2010-10-06 MED ORDER — LOSARTAN POTASSIUM 100 MG PO TABS: 100.0000 mg | ORAL_TABLET | Freq: Every day | ORAL | Status: DC

## 2010-10-06 MED ORDER — IRBESARTAN 300 MG PO TABS: 300.0000 mg | ORAL_TABLET | Freq: Every day | ORAL | Status: DC

## 2010-10-06 MED ORDER — AMLODIPINE BESYLATE 5 MG PO TABS: 5.0000 mg | ORAL_TABLET | Freq: Every day | ORAL | Status: DC

## 2010-10-06 NOTE — Assessment & Plan Note (Signed)
The LDL in Jan was 101 with HDL of 31.  He will continue the medications as listed.

## 2010-10-06 NOTE — Patient Instructions (Signed)
Stop Exforge Start Cozaar 100 mg daily Continue all other medications as ordered See Dr Antoine Poche in 1 year

## 2010-10-06 NOTE — Assessment & Plan Note (Signed)
For cost reasons I will be stopping his Exforge and starting amlodipine 5 mg and Cozaar 100 mg.  They will keep an eye on her blood pressure and let me know if it is well-controlled.

## 2010-10-06 NOTE — Assessment & Plan Note (Signed)
He has had no further syncope and seems to be tolerating reduced dose of beta blocker and spironolactone which were changed in the hospital.  Meds will be changed as listed above.

## 2010-10-06 NOTE — Progress Notes (Signed)
HPI The patient presents with a one-year followup. He has been doing well aside from a hospitalization earlier this month with syncope felt to be related to low blood pressures. I reviewed these hospital records. His metoprolol was reduced. His amlodipine was reduced. His blood pressures have been well controlled at home. He is having no new symptoms. He denies any chest pressure, neck or arm discomfort. He has had no further syncope or presyncope. He has had no palpitations. There has been no shortness of breath, PND or orthopnea.  Allergies  Allergen Reactions  . Benazepril Cough  . Minoxidil     REACTION: wheezes    Current Outpatient Prescriptions  Medication Sig Dispense Refill  . amLODipine-valsartan (EXFORGE) 5-320 MG per tablet Take 1 tablet by mouth daily.        Marland Kitchen aspirin 325 MG tablet Take 325 mg by mouth daily.        . bimatoprost (LUMIGAN) 0.03 % ophthalmic solution Place 1 drop into both eyes at bedtime.        . metoprolol succinate (TOPROL-XL) 25 MG 24 hr tablet Take 25 mg by mouth daily.        . Multiple Vitamin (MULTIVITAMIN) tablet Take 1 tablet by mouth daily.        [name] Glycol-Propyl Glycol (SYSTANE) 0.4-0.3 % SOLN Apply 1 drop to eye 2 (two) times daily.        . simvastatin (ZOCOR) 10 MG tablet Take 10 mg by mouth at bedtime.        Marland Kitchen spironolactone (ALDACTONE) 25 MG tablet Take 25 mg by mouth daily.        Marland Kitchen DISCONTD: amLODipine (NORVASC) 5 MG tablet Take 1 tablet (5 mg total) by mouth daily.  30 tablet  11  . DISCONTD: ciprofloxacin (CIPRO) 500 MG tablet Take 500 mg by mouth 2 (two) times daily. Take 1 tablet by mouth morning and night x 30 days       . DISCONTD: irbesartan (AVAPRO) 300 MG tablet Take 1 tablet (300 mg total) by mouth daily.  30 tablet  11  . DISCONTD: metoprolol (TOPROL-XL) 100 MG 24 hr tablet take 1 tablet by mouth once daily  30 tablet  11  . DISCONTD: spironolactone (ALDACTONE) 50 MG tablet TAKE 1 TABLET BY MOUTH EVERY MORNING  30 tablet   6    Past Medical History  Diagnosis Date  . Essential hypertension     greater than 8 years  . Elevated blood sugar     Recent nonfasting blood sugars with trace of protein in his urine  . History of transient ischemic attack (TIA)   . Diabetes mellitus   . Hyperlipidemia     Past Surgical History  Procedure Date  . Knee surgery     Left knee, as a child  . Knee surgery 1970    ROS:  As stated in the HPI and negative for all other systems.  PHYSICAL EXAM BP 148/80  Pulse 72  Resp 18  Ht 5\' 9"  (1.753 m)  Wt 195 lb 12.8 oz (88.814 kg)  BMI 28.91 kg/m2 GENERAL:  Well appearing HEENT:  Pupils equal round and reactive, fundi not visualized, oral mucosa unremarkable NECK:  No jugular venous distention, waveform within normal limits, carotid upstroke brisk and symmetric, no bruits, no thyromegaly LYMPHATICS:  No cervical, inguinal adenopathy LUNGS:  Clear to auscultation bilaterally BACK:  No CVA tenderness CHEST:  Unremarkable HEART:  PMI not displaced or sustained,S1 and S2 within normal  limits, no S3, no S4, no clicks, no rubs, no murmurs ABD:  Flat, positive bowel sounds normal in frequency in pitch, no bruits, no rebound, no guarding, no midline pulsatile mass, no hepatomegaly, no splenomegaly EXT:  2 plus pulses throughout, no edema, no cyanosis no clubbing SKIN:  No rashes no nodules NEURO:  Cranial nerves II through XII grossly intact, motor grossly intact throughout [facility]:  Cognitively intact, oriented to person place and time   EKG:  (09/19/10)  Sinus rhythm, rate 69, axis within normal limits, intervals within normal limits, no acute ST-T wave changes.   ASSESSMENT AND PLAN

## 2010-10-06 NOTE — Telephone Encounter (Signed)
Rx[s] Done. LMOM to inform Pt. Of new prescriptions.

## 2010-10-22 ENCOUNTER — Encounter: Payer: Self-pay | Admitting: Internal Medicine

## 2010-10-23 ENCOUNTER — Encounter: Payer: Self-pay | Admitting: Internal Medicine

## 2010-10-23 ENCOUNTER — Ambulatory Visit (INDEPENDENT_AMBULATORY_CARE_PROVIDER_SITE_OTHER): Payer: Medicare Other | Admitting: Internal Medicine

## 2010-10-23 ENCOUNTER — Other Ambulatory Visit (INDEPENDENT_AMBULATORY_CARE_PROVIDER_SITE_OTHER): Payer: Medicare Other

## 2010-10-23 VITALS — BP 126/84 | HR 61 | Temp 98.6°F | Resp 16 | Wt 171.0 lb

## 2010-10-23 DIAGNOSIS — E119 Type 2 diabetes mellitus without complications: Secondary | ICD-10-CM

## 2010-10-23 DIAGNOSIS — E7800 Pure hypercholesterolemia, unspecified: Secondary | ICD-10-CM

## 2010-10-23 DIAGNOSIS — I10 Essential (primary) hypertension: Secondary | ICD-10-CM

## 2010-10-23 LAB — HEMOGLOBIN A1C: Hgb A1c MFr Bld: 5.7 % (ref 4.6–6.5)

## 2010-10-23 NOTE — Assessment & Plan Note (Signed)
I will check his A1C today 

## 2010-10-23 NOTE — Progress Notes (Signed)
Subjective:    Patient ID: Tony [name], male    DOB: January 25, 1941, 70 y.o.   MRN: [id number]  Diabetes He presents for his follow-up diabetic visit. He has type 2 diabetes mellitus. His disease course has been improving. There are no hypoglycemic associated symptoms. Pertinent negatives for hypoglycemia include no dizziness, headaches, pallor, seizures, speech difficulty, sweats or tremors. Pertinent negatives for diabetes include no blurred vision, no chest pain, no fatigue, no foot paresthesias, no foot ulcerations, no polydipsia, no polyphagia, no polyuria, no visual change, no weakness and no weight loss. There are no hypoglycemic complications. Symptoms are stable. There are no diabetic complications. Current diabetic treatment includes diet. He is compliant with treatment all of the time. His weight is decreasing steadily. He is following a generally healthy diet. Meal planning includes avoidance of concentrated sweets. He has not had a previous visit with a dietician. He participates in exercise intermittently. His home blood glucose trend is decreasing steadily. His breakfast blood glucose range is generally 70-90 mg/dl. His lunch blood glucose range is generally 90-110 mg/dl. His dinner blood glucose range is generally 90-110 mg/dl. His highest blood glucose is 90-110 mg/dl. His overall blood glucose range is 90-110 mg/dl. An ACE inhibitor/angiotensin II receptor blocker is being taken. He does not see a podiatrist.Eye exam is current.  Hypertension This is a chronic problem. The current episode started more than 1 year ago. The problem has been gradually improving since onset. The problem is controlled. Pertinent negatives include no anxiety, blurred vision, chest pain, headaches, malaise/fatigue, neck pain, orthopnea, palpitations, peripheral edema, PND, shortness of breath or sweats. There are no associated agents to hypertension. Past treatments include angiotensin blockers, calcium channel  blockers and diuretics. The current treatment provides significant improvement. There are no compliance problems.       Review of Systems  Constitutional: Negative for fever, chills, weight loss, malaise/fatigue, diaphoresis, activity change, appetite change, fatigue and unexpected weight change.  HENT: Negative for facial swelling, trouble swallowing, neck pain and voice change.   Eyes: Negative for blurred vision, photophobia and visual disturbance.  Respiratory: Negative for cough, shortness of breath, wheezing and stridor.   Cardiovascular: Negative for chest pain, palpitations, orthopnea, leg swelling and PND.  Gastrointestinal: Negative for vomiting, abdominal pain, diarrhea and constipation.  Genitourinary: Negative for dysuria, polyuria, frequency, hematuria, flank pain, decreased urine volume, enuresis and difficulty urinating.  Musculoskeletal: Negative for myalgias, back pain, joint swelling, arthralgias and gait problem.  Skin: Negative for color change, pallor and rash.  Neurological: Negative for dizziness, tremors, seizures, facial asymmetry, speech difficulty, weakness, light-headedness, numbness and headaches.  Hematological: Negative for polydipsia, polyphagia and adenopathy. Does not bruise/bleed easily.  Psychiatric/Behavioral: Negative.        Objective:   Physical Exam  [vitalsreviewed. Constitutional: He is oriented to person, place, and time. He appears well-developed and well-nourished. No distress.  HENT:  Head: Normocephalic and atraumatic.  Right Ear: External ear normal.  Left Ear: External ear normal.  Nose: Nose normal.  Mouth/Throat: No oropharyngeal exudate.  Eyes: Conjunctivae and EOM are normal. Pupils are equal, round, and reactive to light. Right eye exhibits no discharge. Left eye exhibits no discharge. No scleral icterus.  Neck: Normal range of motion. Neck supple. No JVD present. No tracheal deviation present. No thyromegaly present.    Cardiovascular: Normal rate, regular rhythm and normal heart sounds.  Exam reveals no gallop and no friction rub.   No murmur heard. Pulmonary/Chest: Effort normal and breath sounds normal. No stridor.  No respiratory distress. He has no wheezes. He has no rales. He exhibits no tenderness.  Abdominal: Soft. Bowel sounds are normal. He exhibits no distension and no mass. There is no tenderness. There is no rebound and no guarding.  Musculoskeletal: Normal range of motion. He exhibits no edema and no tenderness.  Lymphadenopathy:    He has no cervical adenopathy.  Neurological: He is alert and oriented to person, place, and time. He has normal reflexes. He displays normal reflexes. No cranial nerve deficit. He exhibits normal muscle tone. Coordination normal.  Skin: Skin is warm and dry. No rash noted. He is not diaphoretic. No erythema. No pallor.  Psychiatric: He has a normal mood and affect. His behavior is normal. Judgment and thought content normal.        Lab Results  Component Value Date   WBC 7.5 05/20/2010   HGB 14.3 09/19/2010   HCT 42.0 09/19/2010   PLT 190.0 05/20/2010   CHOL 158 05/20/2010   TRIG 129.0 05/20/2010   HDL 31.10* 05/20/2010   ALT 32 05/20/2010   AST 26 05/20/2010   NA 139 09/21/2010   K 4.1 09/21/2010   CL 112 09/21/2010   CREATININE 1.16 DELTA CHECK NOTED 09/21/2010   BUN 18 09/21/2010   CO2 21 09/21/2010   TSH 1.319 09/20/2010   PSA 0.77 05/20/2010   INR 1.2 09/20/2008   HGBA1C 6.4 05/20/2010    Assessment & Plan:

## 2010-10-23 NOTE — Patient Instructions (Signed)
Diabetes, Type 2 Diabetes is a lasting (chronic) disease. In type 2 diabetes, the pancreas does not make enough insulin (a hormone), and the body does not respond normally to the insulin that is made. This type of diabetes was also previously called adult onset diabetes. About 90% of all those who have diabetes have type 2. It usually occurs after the age of 40 but can occur at any age. CAUSES Unlike type 1 diabetes, which happens because insulin is no longer being made, type 2 diabetes happens because the body is making less insulin and has trouble using the insulin properly. SYMPTOMS  Drinking more than usual.   Urinating more than usual.   Blurred vision.   Dry, itchy skin.   Frequent infection like yeast infections in women.   More tired than usual (fatigue).  TREATMENT  Healthy eating.   Exercise.   Medication, if needed.   Monitoring blood glucose (sugar).   Seeing your caregiver regularly.  HOME CARE INSTRUCTIONS  Check your blood glucose (sugar) at least once daily. More frequent monitoring may be necessary, depending on your medications and on how well your diabetes is controlled. Your caregiver will advise you.   Take your medicine as directed by your caregiver.   Do not smoke.   Make wise food choices. Ask your caregiver for information. Weight loss can improve your diabetes.   Learn about low blood glucose (hypoglycemia) and how to treat it.   Get your eyes checked regularly.   Have a yearly physical exam. Have your blood pressure checked. Get your blood and urine tested.   Wear a pendant or bracelet saying that you have diabetes.   Check your feet every night for sores. Let your caregiver know if you have sores that are not healing.  SEEK MEDICAL CARE IF:  You are having problems keeping your blood glucose at target range.   You feel you might be having problems with your medicines.   You have symptoms of an illness that is not improving after 24  hours.   You have a sore or wound that is not healing.   You notice a change in vision or a new problem with your vision.   You develop a fever of more than 100.5.  Document Released: 05/04/2005 Document Re-Released: 05/26/2009 [employer] Patient Information 2011 [employer]. 

## 2010-10-23 NOTE — Assessment & Plan Note (Signed)
His BP is well controlled 

## 2010-10-23 NOTE — Assessment & Plan Note (Signed)
This is doing very well ?

## 2010-10-29 ENCOUNTER — Other Ambulatory Visit: Payer: Self-pay | Admitting: Cardiology

## 2011-01-19 ENCOUNTER — Other Ambulatory Visit: Payer: Self-pay | Admitting: Cardiology

## 2011-04-23 ENCOUNTER — Ambulatory Visit (INDEPENDENT_AMBULATORY_CARE_PROVIDER_SITE_OTHER): Payer: Medicare Other | Admitting: Internal Medicine

## 2011-04-23 ENCOUNTER — Encounter: Payer: Self-pay | Admitting: Internal Medicine

## 2011-04-23 ENCOUNTER — Other Ambulatory Visit (INDEPENDENT_AMBULATORY_CARE_PROVIDER_SITE_OTHER): Payer: Medicare Other

## 2011-04-23 DIAGNOSIS — E119 Type 2 diabetes mellitus without complications: Secondary | ICD-10-CM

## 2011-04-23 DIAGNOSIS — E7800 Pure hypercholesterolemia, unspecified: Secondary | ICD-10-CM

## 2011-04-23 DIAGNOSIS — I10 Essential (primary) hypertension: Secondary | ICD-10-CM

## 2011-04-23 DIAGNOSIS — E8881 Metabolic syndrome: Secondary | ICD-10-CM

## 2011-04-23 LAB — COMPREHENSIVE METABOLIC PANEL
BUN: 22 mg/dL (ref 6–23)
CO2: 22 mEq/L (ref 19–32)
Creatinine, Ser: 1.4 mg/dL (ref 0.4–1.5)
GFR: 63.46 mL/min (ref >60.00)
Glucose, Bld: 143 mg/dL — ABNORMAL HIGH (ref 70–99)
Total Bilirubin: 0.8 mg/dL (ref 0.3–1.2)
Total Protein: 7.2 g/dL (ref 6.0–8.3)

## 2011-04-23 LAB — LIPID PANEL
Cholesterol: 149 mg/dL (ref 0–200)
HDL: 34.8 mg/dL — ABNORMAL LOW (ref >39.00)
LDL Cholesterol: 91 mg/dL (ref 0–99)
Triglycerides: 114 mg/dL (ref 0.0–149.0)
VLDL: 22.8 mg/dL (ref 0.0–40.0)

## 2011-04-23 LAB — URINALYSIS, ROUTINE W REFLEX MICROSCOPIC
Ketones, ur: NEGATIVE
Specific Gravity, Urine: 1.02 (ref 1.000–1.030)
Urine Glucose: NEGATIVE
pH: 5.5 (ref 5.0–8.0)

## 2011-04-23 NOTE — Assessment & Plan Note (Signed)
He is doing well on the statin, I will check his labs today

## 2011-04-23 NOTE — Progress Notes (Signed)
Subjective:    Patient ID: Tony [name], male    DOB: [birth year], 70 y.o.   MRN: [id number]  Hypertension This is a chronic problem. The current episode started more than 1 year ago. The problem has been gradually improving since onset. The problem is controlled. Pertinent negatives include no anxiety, blurred vision, chest pain, headaches, malaise/fatigue, neck pain, orthopnea, palpitations, peripheral edema, PND, shortness of breath or sweats. There are no associated agents to hypertension. Past treatments include calcium channel blockers, beta blockers, angiotensin blockers and diuretics. The current treatment provides significant improvement. Compliance problems include exercise and diet.  There is no history of chronic renal disease.  Diabetes He presents for his follow-up diabetic visit. He has type 2 diabetes mellitus. His disease course has been stable. Pertinent negatives for hypoglycemia include no headaches or sweats. Pertinent negatives for diabetes include no blurred vision, no chest pain, no fatigue, no foot paresthesias, no foot ulcerations, no polydipsia, no polyphagia, no polyuria, no visual change, no weakness and no weight loss. There are no hypoglycemic complications. Symptoms are stable. There are no diabetic complications. Current diabetic treatment includes diet. He is compliant with treatment all of the time. His weight is stable. He is following a generally healthy diet. Meal planning includes avoidance of concentrated sweets. He participates in exercise intermittently. There is no change in his home blood glucose trend. An ACE inhibitor/angiotensin II receptor blocker is being taken. He does not see a podiatrist.Eye exam is current.  Hyperlipidemia This is a chronic problem. The current episode started more than 1 year ago. The problem is controlled. Recent lipid tests were reviewed and are variable. Exacerbating diseases include diabetes. He has no history of chronic renal  disease, hypothyroidism, liver disease, obesity or nephrotic syndrome. Factors aggravating his hyperlipidemia include thiazides. Pertinent negatives include no chest pain, focal sensory loss, focal weakness, leg pain, myalgias or shortness of breath. Current antihyperlipidemic treatment includes statins. The current treatment provides moderate improvement of lipids. Compliance problems include adherence to exercise and adherence to diet.       Review of Systems  Constitutional: Negative.  Negative for weight loss, malaise/fatigue and fatigue.  HENT: Negative.  Negative for neck pain.   Eyes: Negative.  Negative for blurred vision.  Respiratory: Negative.  Negative for shortness of breath.   Cardiovascular: Negative.  Negative for chest pain, palpitations, orthopnea and PND.  Gastrointestinal: Negative.   Genitourinary: Negative.  Negative for polyuria.  Musculoskeletal: Negative.  Negative for myalgias.  Skin: Negative.   Neurological: Negative.  Negative for focal weakness, weakness and headaches.  Hematological: Negative.  Negative for polydipsia and polyphagia.  Psychiatric/Behavioral: Negative.        Objective:   Physical Exam  Vitals reviewed. Constitutional: He is oriented to person, place, and time. He appears well-developed and well-nourished. No distress.  HENT:  Head: Normocephalic and atraumatic.  Mouth/Throat: Oropharynx is clear and moist. No oropharyngeal exudate.  Eyes: Conjunctivae are normal. Right eye exhibits no discharge. Left eye exhibits no discharge. No scleral icterus.  Neck: Normal range of motion. Neck supple. No JVD present. No tracheal deviation present. No thyromegaly present.  Cardiovascular: Normal rate, regular rhythm, normal heart sounds and intact distal pulses.  Exam reveals no gallop and no friction rub.   No murmur heard. Pulmonary/Chest: Effort normal and breath sounds normal. No stridor. No respiratory distress. He has no wheezes. He has no  rales. He exhibits no tenderness.  Abdominal: Soft. Bowel sounds are normal. He exhibits no distension. There  is no tenderness. There is no rebound and no guarding.  Musculoskeletal: Normal range of motion. He exhibits no edema and no tenderness.  Lymphadenopathy:    He has no cervical adenopathy.  Neurological: He is oriented to person, place, and time.  Skin: Skin is warm and dry. No rash noted. He is not diaphoretic. No erythema. No pallor.  Psychiatric: He has a normal mood and affect. His behavior is normal. Judgment and thought content normal.     Lab Results  Component Value Date   WBC 7.5 05/20/2010   HGB 14.3 09/19/2010   HCT 42.0 09/19/2010   PLT 190.0 05/20/2010   GLUCOSE 135* 09/21/2010   CHOL 158 05/20/2010   TRIG 129.0 05/20/2010   HDL 31.10* 05/20/2010   LDLCALC 101* 05/20/2010   ALT 32 05/20/2010   AST 26 05/20/2010   NA 139 09/21/2010   K 4.1 09/21/2010   CL 112 09/21/2010   CREATININE 1.16 DELTA CHECK NOTED 09/21/2010   BUN 18 09/21/2010   CO2 21 09/21/2010   TSH 1.319 09/20/2010   PSA 0.77 05/20/2010   INR 1.2 09/20/2008   HGBA1C 5.7 10/23/2010       Assessment & Plan:

## 2011-04-23 NOTE — Patient Instructions (Signed)

## 2011-04-23 NOTE — Assessment & Plan Note (Signed)
His BP is well controlled, I will check his lytes and renal function today 

## 2011-04-23 NOTE — Assessment & Plan Note (Signed)
He seems to be doing well, I will check his a1c today

## 2011-07-03 ENCOUNTER — Other Ambulatory Visit: Payer: Self-pay | Admitting: Cardiology

## 2011-08-04 LAB — HM DIABETES EYE EXAM: HM Diabetic Eye Exam: NORMAL

## 2011-08-21 ENCOUNTER — Encounter: Payer: Self-pay | Admitting: Internal Medicine

## 2011-08-21 ENCOUNTER — Other Ambulatory Visit (INDEPENDENT_AMBULATORY_CARE_PROVIDER_SITE_OTHER): Payer: Medicare Other

## 2011-08-21 ENCOUNTER — Ambulatory Visit (INDEPENDENT_AMBULATORY_CARE_PROVIDER_SITE_OTHER): Payer: Medicare Other | Admitting: Internal Medicine

## 2011-08-21 VITALS — BP 132/76 | HR 68 | Temp 98.6°F | Resp 16 | Wt 190.0 lb

## 2011-08-21 DIAGNOSIS — I10 Essential (primary) hypertension: Secondary | ICD-10-CM

## 2011-08-21 DIAGNOSIS — E7800 Pure hypercholesterolemia, unspecified: Secondary | ICD-10-CM

## 2011-08-21 DIAGNOSIS — G459 Transient cerebral ischemic attack, unspecified: Secondary | ICD-10-CM

## 2011-08-21 DIAGNOSIS — E119 Type 2 diabetes mellitus without complications: Secondary | ICD-10-CM

## 2011-08-21 LAB — CBC WITH DIFFERENTIAL/PLATELET
Basophils Absolute: 0 10*3/uL (ref 0.0–0.1)
Eosinophils Absolute: 0.3 10*3/uL (ref 0.0–0.7)
HCT: 44.1 % (ref 39.0–52.0)
Hemoglobin: 14.6 g/dL (ref 13.0–17.0)
Lymphocytes Relative: 31.8 % (ref 12.0–46.0)
Lymphs Abs: 2.4 10*3/uL (ref 0.7–4.0)
MCHC: 33 g/dL (ref 30.0–36.0)
Neutro Abs: 4 10*3/uL (ref 1.4–7.7)
RDW: 12.2 % (ref 11.5–14.6)

## 2011-08-21 LAB — COMPREHENSIVE METABOLIC PANEL
ALT: 40 U/L (ref 0–53)
AST: 30 U/L (ref 0–37)
Calcium: 9.9 mg/dL (ref 8.4–10.5)
Chloride: 101 mEq/L (ref 96–112)
Creatinine, Ser: 1.5 mg/dL (ref 0.4–1.5)
Sodium: 135 mEq/L (ref 135–145)
Total Protein: 7.7 g/dL (ref 6.0–8.3)

## 2011-08-21 LAB — LIPID PANEL: Total CHOL/HDL Ratio: 5

## 2011-08-21 LAB — HEMOGLOBIN A1C: Hgb A1c MFr Bld: 8.5 % — ABNORMAL HIGH (ref 4.6–6.5)

## 2011-08-21 LAB — TSH: TSH: 1.6 u[IU]/mL (ref 0.35–5.50)

## 2011-08-21 MED ORDER — SITAGLIP PHOS-METFORMIN HCL ER 100-1000 MG PO TB24: 1.0000 | ORAL_TABLET | Freq: Every day | ORAL | Status: DC

## 2011-08-21 NOTE — Assessment & Plan Note (Signed)
NED today 

## 2011-08-21 NOTE — Patient Instructions (Signed)

## 2011-08-21 NOTE — Assessment & Plan Note (Signed)
He is doing well on zocor, I will check his CMP CPK FLP today

## 2011-08-21 NOTE — Assessment & Plan Note (Addendum)
I will check his a1c today and see if he needs meds for DM, his a1c is high so I have asked him to start [employer]-xr

## 2011-08-21 NOTE — Assessment & Plan Note (Signed)
His BP is well controlled, I will check his lytes and renal function today 

## 2011-08-21 NOTE — Progress Notes (Addendum)
  Subjective:    Patient ID: Tony [name], male    DOB: 20-Aug-1940, 71 y.o.   MRN: [id number]  Hypertension This is a chronic problem. The current episode started more than 1 year ago. The problem has been gradually worsening since onset. The problem is controlled. Pertinent negatives include no anxiety, blurred vision, chest pain, headaches, malaise/fatigue, neck pain, orthopnea, palpitations, peripheral edema, PND, shortness of breath or sweats. Past treatments include beta blockers, angiotensin blockers and diuretics. The current treatment provides moderate improvement. Compliance problems include exercise and diet.       Review of Systems  Constitutional: Negative for fever, chills, malaise/fatigue, diaphoresis, activity change, appetite change, fatigue and unexpected weight change.  HENT: Negative.  Negative for neck pain.   Eyes: Negative.  Negative for blurred vision.  Respiratory: Negative for apnea, cough, choking, chest tightness, shortness of breath, wheezing and stridor.   Cardiovascular: Negative for chest pain, palpitations, orthopnea, leg swelling and PND.  Gastrointestinal: Negative for nausea, vomiting, abdominal pain, diarrhea, constipation and blood in stool.  Genitourinary: Negative.   Musculoskeletal: Negative for myalgias, back pain, joint swelling, arthralgias and gait problem.  Skin: Negative for color change, pallor, rash and wound.  Neurological: Negative for dizziness, tremors, seizures, syncope, facial asymmetry, speech difficulty, weakness, light-headedness, numbness and headaches.  Hematological: Negative for adenopathy. Does not bruise/bleed easily.  Psychiatric/Behavioral: Negative.        Objective:   Physical Exam  Vitals reviewed. Constitutional: He is oriented to person, place, and time. He appears well-developed and well-nourished. No distress.  HENT:  Head: Normocephalic and atraumatic.  Mouth/Throat: Oropharynx is clear and moist. No oropharyngeal  exudate.  Eyes: Conjunctivae are normal. Right eye exhibits no discharge. Left eye exhibits no discharge. No scleral icterus.  Neck: Normal range of motion. Neck supple. No JVD present. No tracheal deviation present. No thyromegaly present.  Cardiovascular: Normal rate, regular rhythm, normal heart sounds and intact distal pulses.  Exam reveals no gallop and no friction rub.   No murmur heard. Pulmonary/Chest: Effort normal and breath sounds normal. No stridor. No respiratory distress. He has no wheezes. He has no rales. He exhibits no tenderness.  Abdominal: Soft. Bowel sounds are normal. He exhibits no distension and no mass. There is no tenderness. There is no rebound and no guarding.  Musculoskeletal: Normal range of motion. He exhibits no edema and no tenderness.  Lymphadenopathy:    He has no cervical adenopathy.  Neurological: He is oriented to person, place, and time.  Skin: Skin is warm and dry. No rash noted. He is not diaphoretic. No erythema. No pallor.  Psychiatric: He has a normal mood and affect. His behavior is normal. Judgment and thought content normal.      Lab Results  Component Value Date   WBC 7.5 05/20/2010   HGB 14.3 09/19/2010   HCT 42.0 09/19/2010   PLT 190.0 05/20/2010   GLUCOSE 143* 04/23/2011   CHOL 149 04/23/2011   TRIG 114.0 04/23/2011   HDL 34.80* 04/23/2011   LDLCALC 91 04/23/2011   ALT 35 04/23/2011   AST 31 04/23/2011   NA 137 04/23/2011   K 4.6 04/23/2011   CL 107 04/23/2011   CREATININE 1.4 04/23/2011   BUN 22 04/23/2011   CO2 22 04/23/2011   TSH 1.83 04/23/2011   PSA 0.77 05/20/2010   INR 1.2 09/20/2008   HGBA1C 6.0 04/23/2011       Assessment & Plan:

## 2011-08-21 NOTE — Progress Notes (Signed)
Addended by: Etta Grandchild on: 08/21/2011 12:45 PM   Modules accepted: Orders

## 2011-09-02 ENCOUNTER — Other Ambulatory Visit: Payer: Self-pay | Admitting: Cardiology

## 2011-09-08 ENCOUNTER — Telehealth: Payer: Self-pay

## 2011-09-08 NOTE — Telephone Encounter (Signed)
Called [facility], [employer] approved thru 05/17/12

## 2011-10-01 ENCOUNTER — Other Ambulatory Visit: Payer: Self-pay | Admitting: Cardiology

## 2011-10-13 ENCOUNTER — Ambulatory Visit (INDEPENDENT_AMBULATORY_CARE_PROVIDER_SITE_OTHER): Payer: Medicare Other | Admitting: Cardiology

## 2011-10-13 ENCOUNTER — Encounter: Payer: Self-pay | Admitting: Cardiology

## 2011-10-13 VITALS — BP 120/75 | HR 68 | Ht 69.0 in | Wt 185.4 lb

## 2011-10-13 DIAGNOSIS — E119 Type 2 diabetes mellitus without complications: Secondary | ICD-10-CM

## 2011-10-13 DIAGNOSIS — G459 Transient cerebral ischemic attack, unspecified: Secondary | ICD-10-CM

## 2011-10-13 DIAGNOSIS — I10 Essential (primary) hypertension: Secondary | ICD-10-CM

## 2011-10-13 DIAGNOSIS — E7800 Pure hypercholesterolemia, unspecified: Secondary | ICD-10-CM

## 2011-10-13 NOTE — Patient Instructions (Signed)
The current medical regimen is effective;  continue present plan and medications.  Follow up in 1 year with Dr Hochrein.  You will receive a letter in the mail 2 months before you are due.  Please call us when you receive this letter to schedule your follow up appointment.  

## 2011-10-13 NOTE — Assessment & Plan Note (Signed)
He now has a hemoglobin A1c of 8.5. This is being by Dr. Yetta Barre. As above we discussed diet.
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
## 2011-10-13 NOTE — Assessment & Plan Note (Signed)
His blood pressure is controlled. His labs are up-to-date. He doesn't mind the mild gynecomastia he has. No change in therapy is indicated.

## 2011-10-13 NOTE — Assessment & Plan Note (Signed)
His last LDL was 89 with an HDL of 45.4. We discussed his high triglycerides. We discussed dietary modifications to treat this in his newly diagnosed diabetes.

## 2011-10-31 ENCOUNTER — Other Ambulatory Visit: Payer: Self-pay | Admitting: Cardiology

## 2011-11-13 ENCOUNTER — Other Ambulatory Visit: Payer: Self-pay

## 2011-11-13 MED ORDER — AMLODIPINE BESYLATE 5 MG PO TABS: 5.0000 mg | ORAL_TABLET | Freq: Every day | ORAL | Status: DC

## 2011-11-13 MED ORDER — IRBESARTAN 300 MG PO TABS: 300.0000 mg | ORAL_TABLET | Freq: Every day | ORAL | Status: DC

## 2011-12-03 ENCOUNTER — Other Ambulatory Visit: Payer: Self-pay | Admitting: Cardiology

## 2011-12-03 NOTE — Telephone Encounter (Signed)
..   Requested Prescriptions   Pending Prescriptions Disp Refills  . simvastatin (ZOCOR) 10 MG tablet [Pharmacy Med Name: SIMVASTATIN 10 MG TABLET] 90 tablet 3    Sig: take 1 tablet by mouth at bedtime

## 2011-12-21 ENCOUNTER — Other Ambulatory Visit (INDEPENDENT_AMBULATORY_CARE_PROVIDER_SITE_OTHER): Payer: Medicare Other

## 2011-12-21 ENCOUNTER — Encounter: Payer: Self-pay | Admitting: Internal Medicine

## 2011-12-21 ENCOUNTER — Ambulatory Visit (INDEPENDENT_AMBULATORY_CARE_PROVIDER_SITE_OTHER): Payer: Medicare Other | Admitting: Internal Medicine

## 2011-12-21 VITALS — BP 122/78 | HR 64 | Temp 98.0°F | Resp 16 | Wt 183.0 lb

## 2011-12-21 DIAGNOSIS — E119 Type 2 diabetes mellitus without complications: Secondary | ICD-10-CM

## 2011-12-21 DIAGNOSIS — I10 Essential (primary) hypertension: Secondary | ICD-10-CM

## 2011-12-21 DIAGNOSIS — E7800 Pure hypercholesterolemia, unspecified: Secondary | ICD-10-CM

## 2011-12-21 LAB — COMPREHENSIVE METABOLIC PANEL
ALT: 31 U/L (ref 0–53)
AST: 22 U/L (ref 0–37)
Albumin: 4.1 g/dL (ref 3.5–5.2)
Alkaline Phosphatase: 41 U/L (ref 39–117)
BUN: 20 mg/dL (ref 6–23)
Calcium: 9.6 mg/dL (ref 8.4–10.5)
Chloride: 108 mEq/L (ref 96–112)
Potassium: 4.5 mEq/L (ref 3.5–5.1)
Sodium: 138 mEq/L (ref 135–145)
Total Protein: 7 g/dL (ref 6.0–8.3)

## 2011-12-21 LAB — HEMOGLOBIN A1C: Hgb A1c MFr Bld: 5 % (ref 4.6–6.5)

## 2011-12-21 NOTE — Addendum Note (Signed)
Addended by: Etta Grandchild on: 12/21/2011 12:39 PM   Modules accepted: Orders

## 2011-12-21 NOTE — Progress Notes (Signed)
  Subjective:    Patient ID: Tony [name], male    DOB: January 26, 1941, 71 y.o.   MRN: [id number]  Hypertension This is a chronic problem. The problem has been gradually improving since onset. The problem is controlled. Pertinent negatives include no anxiety, blurred vision, chest pain, headaches, malaise/fatigue, neck pain, orthopnea, palpitations, peripheral edema, PND, shortness of breath or sweats. Past treatments include angiotensin blockers and beta blockers. The current treatment provides significant improvement. There are no compliance problems.       Review of Systems  Constitutional: Negative for fever, chills, malaise/fatigue, diaphoresis, activity change, appetite change, fatigue and unexpected weight change.  HENT: Negative.  Negative for neck pain.   Eyes: Negative for blurred vision.  Respiratory: Negative for cough, chest tightness, shortness of breath, wheezing and stridor.   Cardiovascular: Negative for chest pain, palpitations, orthopnea, leg swelling and PND.  Gastrointestinal: Negative for nausea, vomiting, abdominal pain, diarrhea and constipation.  Genitourinary: Negative.   Musculoskeletal: Negative for myalgias, back pain, joint swelling, arthralgias and gait problem.  Skin: Negative.   Neurological: Positive for dizziness. Negative for tremors, seizures, syncope, speech difficulty, weakness, light-headedness, numbness and headaches.  Hematological: Negative for adenopathy. Does not bruise/bleed easily.  Psychiatric/Behavioral: Negative.        Objective:   Physical Exam  Vitals reviewed. Constitutional: He is oriented to person, place, and time. He appears well-developed and well-nourished. No distress.  HENT:  Head: Normocephalic and atraumatic.  Mouth/Throat: Oropharynx is clear and moist. No oropharyngeal exudate.  Eyes: Conjunctivae are normal. Right eye exhibits no discharge. Left eye exhibits no discharge. No scleral icterus.  Neck: Normal range of motion.  Neck supple. No JVD present. No tracheal deviation present. No thyromegaly present.  Cardiovascular: Normal rate, regular rhythm, normal heart sounds and intact distal pulses.  Exam reveals no gallop and no friction rub.   No murmur heard. Pulmonary/Chest: Effort normal and breath sounds normal. No stridor. No respiratory distress. He has no wheezes. He has no rales. He exhibits no tenderness.  Abdominal: Soft. Bowel sounds are normal. He exhibits no distension and no mass. There is no tenderness. There is no rebound and no guarding.  Musculoskeletal: Normal range of motion. He exhibits no edema and no tenderness.  Lymphadenopathy:    He has no cervical adenopathy.  Neurological: He is oriented to person, place, and time.  Skin: Skin is warm and dry. No rash noted. He is not diaphoretic. No erythema. No pallor.  Psychiatric: He has a normal mood and affect. His behavior is normal. Judgment and thought content normal.      Lab Results  Component Value Date   WBC 7.6 08/21/2011   HGB 14.6 08/21/2011   HCT 44.1 08/21/2011   PLT 181.0 08/21/2011   GLUCOSE 340* 08/21/2011   CHOL 162 08/21/2011   TRIG 198.0* 08/21/2011   HDL 33.20* 08/21/2011   LDLCALC 89 08/21/2011   ALT 40 08/21/2011   AST 30 08/21/2011   NA 135 08/21/2011   K 4.6 08/21/2011   CL 101 08/21/2011   CREATININE 1.5 08/21/2011   BUN 26* 08/21/2011   CO2 23 08/21/2011   TSH 1.60 08/21/2011   PSA 0.77 05/20/2010   INR 1.2 09/20/2008   HGBA1C 8.5* 08/21/2011      Assessment & Plan:

## 2011-12-21 NOTE — Assessment & Plan Note (Signed)
He is doing well on Zocor

## 2011-12-21 NOTE — Assessment & Plan Note (Signed)
He developed some dizziness 2 weeks ago and his BP dropped to 110/60 while he was working in his yard so he stopped the diuretic and amlodipine and he has felt well since then, I will check his lytes and renal function today and will keep taking avapro and metoprolol.

## 2011-12-21 NOTE — Patient Instructions (Signed)

## 2011-12-21 NOTE — Assessment & Plan Note (Addendum)
I will check his a1c today and will make changes in his meds if needed, I will also check his renal function  Late note - his A1C= 5 so I have asked him to stop Janumet

## 2012-02-22 ENCOUNTER — Ambulatory Visit (INDEPENDENT_AMBULATORY_CARE_PROVIDER_SITE_OTHER): Payer: Medicare Other | Admitting: Internal Medicine

## 2012-02-22 ENCOUNTER — Encounter: Payer: Self-pay | Admitting: Internal Medicine

## 2012-02-22 VITALS — BP 138/82 | HR 67 | Temp 98.2°F | Resp 16 | Wt 188.0 lb

## 2012-02-22 DIAGNOSIS — Z23 Encounter for immunization: Secondary | ICD-10-CM

## 2012-02-22 DIAGNOSIS — I10 Essential (primary) hypertension: Secondary | ICD-10-CM

## 2012-02-22 DIAGNOSIS — E119 Type 2 diabetes mellitus without complications: Secondary | ICD-10-CM

## 2012-02-22 DIAGNOSIS — E7800 Pure hypercholesterolemia, unspecified: Secondary | ICD-10-CM

## 2012-02-22 NOTE — Assessment & Plan Note (Signed)
A1C looks great 

## 2012-02-22 NOTE — Progress Notes (Signed)
  Subjective:    Patient ID: Tony [name], male    DOB: 09-14-1940, 71 y.o.   MRN: [id number]  Hypertension This is a chronic problem. The current episode started more than 1 year ago. The problem has been gradually improving since onset. The problem is controlled. Pertinent negatives include no anxiety, blurred vision, chest pain, headaches, malaise/fatigue, neck pain, orthopnea, palpitations, peripheral edema, PND, shortness of breath or sweats. Past treatments include angiotensin blockers and calcium channel blockers. The current treatment provides significant improvement. Compliance problems include exercise and diet.       Review of Systems  Constitutional: Negative for fever, chills, malaise/fatigue, diaphoresis, activity change, appetite change, fatigue and unexpected weight change.  HENT: Negative.  Negative for neck pain.   Eyes: Negative.  Negative for blurred vision.  Respiratory: Negative for cough, chest tightness, shortness of breath, wheezing and stridor.   Cardiovascular: Negative for chest pain, palpitations, orthopnea, leg swelling and PND.  Gastrointestinal: Negative for nausea, vomiting, abdominal pain, diarrhea, constipation and blood in stool.  Genitourinary: Negative.   Musculoskeletal: Negative.   Skin: Negative.   Neurological: Negative.  Negative for headaches.  Hematological: Negative for adenopathy. Does not bruise/bleed easily.       Objective:   Physical Exam  Vitals reviewed. Constitutional: He is oriented to person, place, and time. He appears well-developed and well-nourished. No distress.  HENT:  Head: Normocephalic and atraumatic.  Mouth/Throat: Oropharynx is clear and moist. No oropharyngeal exudate.  Eyes: Conjunctivae normal are normal. Right eye exhibits no discharge. Left eye exhibits no discharge. No scleral icterus.  Neck: Normal range of motion. Neck supple. No JVD present. No tracheal deviation present. No thyromegaly present.    Cardiovascular: Normal rate, regular rhythm, normal heart sounds and intact distal pulses.  Exam reveals no gallop and no friction rub.   No murmur heard. Pulmonary/Chest: Effort normal and breath sounds normal. No stridor. No respiratory distress. He has no wheezes. He has no rales. He exhibits no tenderness.  Abdominal: Soft. Bowel sounds are normal. He exhibits no distension and no mass. There is no tenderness. There is no rebound and no guarding.  Musculoskeletal: Normal range of motion. He exhibits edema (trace edema in BLE). He exhibits no tenderness.  Lymphadenopathy:    He has no cervical adenopathy.  Neurological: He is alert and oriented to person, place, and time.  Skin: Skin is warm and dry. No rash noted. He is not diaphoretic. No erythema. No pallor.  Psychiatric: He has a normal mood and affect. His behavior is normal. Judgment and thought content normal.      Lab Results  Component Value Date   WBC 7.6 08/21/2011   HGB 14.6 08/21/2011   HCT 44.1 08/21/2011   PLT 181.0 08/21/2011   GLUCOSE 104* 12/21/2011   CHOL 162 08/21/2011   TRIG 198.0* 08/21/2011   HDL 33.20* 08/21/2011   LDLCALC 89 08/21/2011   ALT 31 12/21/2011   AST 22 12/21/2011   NA 138 12/21/2011   K 4.5 12/21/2011   CL 108 12/21/2011   CREATININE 1.4 12/21/2011   BUN 20 12/21/2011   CO2 25 12/21/2011   TSH 1.60 08/21/2011   PSA 0.77 05/20/2010   INR 1.2 09/20/2008   HGBA1C 5.0 12/21/2011      Assessment & Plan:

## 2012-02-22 NOTE — Assessment & Plan Note (Signed)
His BP is well controlled 

## 2012-02-22 NOTE — Patient Instructions (Signed)

## 2012-02-22 NOTE — Assessment & Plan Note (Signed)
He is doing well on zocor 

## 2012-03-09 ENCOUNTER — Other Ambulatory Visit: Payer: Self-pay | Admitting: Cardiology

## 2012-05-15 ENCOUNTER — Other Ambulatory Visit: Payer: Self-pay | Admitting: Internal Medicine

## 2012-05-18 ENCOUNTER — Other Ambulatory Visit: Payer: Self-pay | Admitting: Internal Medicine

## 2012-06-24 ENCOUNTER — Other Ambulatory Visit (INDEPENDENT_AMBULATORY_CARE_PROVIDER_SITE_OTHER): Payer: Medicare Other

## 2012-06-24 ENCOUNTER — Encounter: Payer: Self-pay | Admitting: Internal Medicine

## 2012-06-24 ENCOUNTER — Ambulatory Visit (INDEPENDENT_AMBULATORY_CARE_PROVIDER_SITE_OTHER): Payer: Medicare Other | Admitting: Internal Medicine

## 2012-06-24 VITALS — BP 110/72 | HR 72 | Temp 98.7°F | Resp 16 | Wt 191.8 lb

## 2012-06-24 DIAGNOSIS — N138 Other obstructive and reflux uropathy: Secondary | ICD-10-CM

## 2012-06-24 DIAGNOSIS — I10 Essential (primary) hypertension: Secondary | ICD-10-CM

## 2012-06-24 DIAGNOSIS — N401 Enlarged prostate with lower urinary tract symptoms: Secondary | ICD-10-CM

## 2012-06-24 DIAGNOSIS — E7800 Pure hypercholesterolemia, unspecified: Secondary | ICD-10-CM

## 2012-06-24 DIAGNOSIS — Z136 Encounter for screening for cardiovascular disorders: Secondary | ICD-10-CM

## 2012-06-24 DIAGNOSIS — Z0000 Encounter for general adult medical examination without abnormal findings: Secondary | ICD-10-CM

## 2012-06-24 LAB — URINALYSIS, ROUTINE W REFLEX MICROSCOPIC
Bilirubin Urine: NEGATIVE
Ketones, ur: NEGATIVE
Total Protein, Urine: NEGATIVE
pH: 6 (ref 5.0–8.0)

## 2012-06-24 LAB — LIPID PANEL
Cholesterol: 152 mg/dL (ref 0–200)
HDL: 32.8 mg/dL — ABNORMAL LOW (ref >39.00)
LDL Cholesterol: 89 mg/dL (ref 0–99)
Triglycerides: 150 mg/dL — ABNORMAL HIGH (ref 0.0–149.0)

## 2012-06-24 LAB — CBC WITH DIFFERENTIAL/PLATELET
Eosinophils Absolute: 0.3 10*3/uL (ref 0.0–0.7)
Eosinophils Relative: 3.7 % (ref 0.0–5.0)
Lymphocytes Relative: 35.5 % (ref 12.0–46.0)
MCV: 90.4 fl (ref 78.0–100.0)
Monocytes Absolute: 1 10*3/uL (ref 0.1–1.0)
Neutrophils Relative %: 46.6 % (ref 43.0–77.0)
Platelets: 182 10*3/uL (ref 150.0–400.0)
WBC: 7.2 10*3/uL (ref 4.5–10.5)

## 2012-06-24 LAB — COMPREHENSIVE METABOLIC PANEL
Albumin: 4.3 g/dL (ref 3.5–5.2)
Alkaline Phosphatase: 56 U/L (ref 39–117)
BUN: 22 mg/dL (ref 6–23)
CO2: 27 mEq/L (ref 19–32)
Calcium: 10.1 mg/dL (ref 8.4–10.5)
Chloride: 105 mEq/L (ref 96–112)
Glucose, Bld: 188 mg/dL — ABNORMAL HIGH (ref 70–99)
Potassium: 4.3 mEq/L (ref 3.5–5.1)
Sodium: 139 mEq/L (ref 135–145)
Total Protein: 7.2 g/dL (ref 6.0–8.3)

## 2012-06-24 NOTE — Progress Notes (Signed)
Subjective:    Patient ID: Tony [name], male    DOB: [birth year], 72 y.o.   MRN: [id number]  Hypertension This is a chronic problem. The current episode started more than 1 year ago. The problem has been gradually improving since onset. The problem is controlled. Pertinent negatives include no anxiety, blurred vision, chest pain, headaches, malaise/fatigue, neck pain, orthopnea, palpitations, peripheral edema, PND, shortness of breath or sweats. Past treatments include angiotensin blockers, calcium channel blockers and beta blockers. The current treatment provides significant improvement. Compliance problems include diet and exercise.       Review of Systems  Constitutional: Negative.  Negative for malaise/fatigue.  HENT: Negative.  Negative for neck pain.   Eyes: Negative.  Negative for blurred vision.  Respiratory: Negative.  Negative for chest tightness, shortness of breath, wheezing and stridor.   Cardiovascular: Negative.  Negative for chest pain, palpitations, orthopnea, leg swelling and PND.  Gastrointestinal: Negative.  Negative for nausea, diarrhea, constipation and anal bleeding.  Genitourinary: Negative for urgency, frequency, hematuria, decreased urine volume and difficulty urinating.  Musculoskeletal: Negative.  Negative for myalgias, back pain, joint swelling, arthralgias and gait problem.  Skin: Negative.   Neurological: Negative.  Negative for dizziness, weakness, light-headedness and headaches.  Hematological: Negative for adenopathy. Does not bruise/bleed easily.  Psychiatric/Behavioral: Negative.        Objective:   Physical Exam  Vitals reviewed. Constitutional: He is oriented to person, place, and time. He appears well-developed and well-nourished. No distress.  HENT:  Head: Normocephalic and atraumatic.  Mouth/Throat: Oropharynx is clear and moist. No oropharyngeal exudate.  Eyes: Conjunctivae normal are normal. Right eye exhibits no discharge. Left eye exhibits  no discharge. No scleral icterus.  Neck: Normal range of motion. Neck supple. No JVD present. No tracheal deviation present. No thyromegaly present.  Cardiovascular: Normal rate, regular rhythm, normal heart sounds and intact distal pulses.  Exam reveals no gallop and no friction rub.   No murmur heard. Pulmonary/Chest: Effort normal and breath sounds normal. No stridor. No respiratory distress. He has no wheezes. He has no rales. He exhibits no tenderness.  Abdominal: Soft. Bowel sounds are normal. He exhibits no distension and no mass. There is no tenderness. There is no rebound and no guarding. Hernia confirmed negative in the right inguinal area and confirmed negative in the left inguinal area.  Genitourinary: Rectum normal, prostate normal, testes normal and penis normal. Rectal exam shows no external hemorrhoid, no internal hemorrhoid, no fissure, no mass, no tenderness and anal tone normal. Guaiac negative stool. Prostate is not enlarged and not tender. Right testis shows no mass, no swelling and no tenderness. Right testis is descended. Left testis shows no mass, no swelling and no tenderness. Left testis is descended. Uncircumcised. No phimosis, paraphimosis, hypospadias, penile erythema or penile tenderness. No discharge found.  Musculoskeletal: Normal range of motion. He exhibits no edema and no tenderness.  Lymphadenopathy:    He has no cervical adenopathy.       Right: No inguinal adenopathy present.       Left: No inguinal adenopathy present.  Neurological: He is oriented to person, place, and time.  Skin: Skin is warm and dry. No rash noted. He is not diaphoretic. No erythema. No pallor.  Psychiatric: He has a normal mood and affect. His behavior is normal. Judgment and thought content normal.     Lab Results  Component Value Date   WBC 7.6 08/21/2011   HGB 14.6 08/21/2011   HCT 44.1 08/21/2011  PLT 181.0 08/21/2011   GLUCOSE 104* 12/21/2011   CHOL 162 08/21/2011   TRIG 198.0* 08/21/2011    HDL 33.20* 08/21/2011   LDLCALC 89 08/21/2011   ALT 31 12/21/2011   AST 22 12/21/2011   NA 138 12/21/2011   K 4.5 12/21/2011   CL 108 12/21/2011   CREATININE 1.4 12/21/2011   BUN 20 12/21/2011   CO2 25 12/21/2011   TSH 1.60 08/21/2011   PSA 0.77 05/20/2010   INR 1.2 09/20/2008   HGBA1C 5.0 12/21/2011       Assessment & Plan:

## 2012-06-24 NOTE — Assessment & Plan Note (Signed)
He has no s/s Exam is normal I will check his PSA today

## 2012-06-24 NOTE — Assessment & Plan Note (Signed)
His EKG shows minimal LAE but there is no LVH no ischemia no Q waves

## 2012-06-24 NOTE — Patient Instructions (Signed)
Health Maintenance, Males A healthy lifestyle and preventative care can promote health and wellness.  Maintain regular health, dental, and eye exams.  Eat a healthy diet. Foods like vegetables, fruits, whole grains, low-fat dairy products, and lean protein foods contain the nutrients you need without too many calories. Decrease your intake of foods high in solid fats, added sugars, and salt. Get information about a proper diet from your caregiver, if necessary.  Regular physical exercise is one of the most important things you can do for your health. Most adults should get at least 150 minutes of moderate-intensity exercise (any activity that increases your heart rate and causes you to sweat) each week. In addition, most adults need muscle-strengthening exercises on 2 or more days a week.   Maintain a healthy weight. The body mass index (BMI) is a screening tool to identify possible weight problems. It provides an estimate of body fat based on height and weight. Your caregiver can help determine your BMI, and can help you achieve or maintain a healthy weight. For adults 20 years and older:  A BMI below 18.5 is considered underweight.  A BMI of 18.5 to 24.9 is normal.  A BMI of 25 to 29.9 is considered overweight.  A BMI of 30 and above is considered obese.  Maintain normal blood lipids and cholesterol by exercising and minimizing your intake of saturated fat. Eat a balanced diet with plenty of fruits and vegetables. Blood tests for lipids and cholesterol should begin at age 20 and be repeated every 5 years. If your lipid or cholesterol levels are high, you are over 50, or you are a high risk for heart disease, you may need your cholesterol levels checked more frequently.Ongoing high lipid and cholesterol levels should be treated with medicines, if diet and exercise are not effective.  If you smoke, find out from your caregiver how to quit. If you do not use tobacco, do not start.  If you  choose to drink alcohol, do not exceed 2 drinks per day. One drink is considered to be 12 ounces (355 mL) of beer, 5 ounces (148 mL) of wine, or 1.5 ounces (44 mL) of liquor.  Avoid use of street drugs. Do not share needles with anyone. Ask for help if you need support or instructions about stopping the use of drugs.  High blood pressure causes heart disease and increases the risk of stroke. Blood pressure should be checked at least every 1 to 2 years. Ongoing high blood pressure should be treated with medicines if weight loss and exercise are not effective.  If you are 45 to 71 years old, ask your caregiver if you should take aspirin to prevent heart disease.  Diabetes screening involves taking a blood sample to check your fasting blood sugar level. This should be done once every 3 years, after age 45, if you are within normal weight and without risk factors for diabetes. Testing should be considered at a younger age or be carried out more frequently if you are overweight and have at least 1 risk factor for diabetes.  Colorectal cancer can be detected and often prevented. Most routine colorectal cancer screening begins at the age of 50 and continues through age 75. However, your caregiver may recommend screening at an earlier age if you have risk factors for colon cancer. On a yearly basis, your caregiver may provide home test kits to check for hidden blood in the stool. Use of a small camera at the end of a tube,   to directly examine the colon (sigmoidoscopy or colonoscopy), can detect the earliest forms of colorectal cancer. Talk to your caregiver about this at age 50, when routine screening begins. Direct examination of the colon should be repeated every 5 to 10 years through age 75, unless early forms of pre-cancerous polyps or small growths are found.  Hepatitis C blood testing is recommended for all people born from 1945 through 1965 and any individual with known risks for hepatitis C.  Healthy  men should no longer receive prostate-specific antigen (PSA) blood tests as part of routine cancer screening. Consult with your caregiver about prostate cancer screening.  Testicular cancer screening is not recommended for adolescents or adult males who have no symptoms. Screening includes self-exam, caregiver exam, and other screening tests. Consult with your caregiver about any symptoms you have or any concerns you have about testicular cancer.  Practice safe sex. Use condoms and avoid high-risk sexual practices to reduce the spread of sexually transmitted infections (STIs).  Use sunscreen with a sun protection factor (SPF) of 30 or greater. Apply sunscreen liberally and repeatedly throughout the day. You should seek shade when your shadow is shorter than you. Protect yourself by wearing long sleeves, pants, a wide-brimmed hat, and sunglasses year round, whenever you are outdoors.  Notify your caregiver of new moles or changes in moles, especially if there is a change in shape or color. Also notify your caregiver if a mole is larger than the size of a pencil eraser.  A one-time screening for abdominal aortic aneurysm (AAA) and surgical repair of large AAAs by sound wave imaging (ultrasonography) is recommended for ages 65 to 75 years who are current or former smokers.  Stay current with your immunizations. Document Released: 10/31/2007 Document Revised: 07/27/2011 Document Reviewed: 09/29/2010 [employer] Patient Information 2013 [employer].  

## 2012-06-24 NOTE — Assessment & Plan Note (Addendum)

## 2012-06-24 NOTE — Assessment & Plan Note (Signed)
He is doing well on zocor I will recheck his FLP today and will address if needed

## 2012-06-24 NOTE — Assessment & Plan Note (Signed)
His BP is well controlled I will check his lytes and renal function 

## 2012-08-12 ENCOUNTER — Other Ambulatory Visit: Payer: Self-pay | Admitting: Cardiology

## 2012-10-13 ENCOUNTER — Encounter: Payer: Self-pay | Admitting: Cardiology

## 2012-10-13 ENCOUNTER — Ambulatory Visit (INDEPENDENT_AMBULATORY_CARE_PROVIDER_SITE_OTHER): Payer: Medicare Other | Admitting: Cardiology

## 2012-10-13 VITALS — BP 135/75 | HR 68 | Ht 69.0 in | Wt 186.8 lb

## 2012-10-13 DIAGNOSIS — Z136 Encounter for screening for cardiovascular disorders: Secondary | ICD-10-CM

## 2012-10-13 DIAGNOSIS — G459 Transient cerebral ischemic attack, unspecified: Secondary | ICD-10-CM

## 2012-10-13 DIAGNOSIS — I10 Essential (primary) hypertension: Secondary | ICD-10-CM
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
## 2012-10-13 NOTE — Patient Instructions (Addendum)
The current medical regimen is effective;  continue present plan and medications.  Follow up in 1 year with Dr Hochrein.  You will receive a letter in the mail 2 months before you are due.  Please call us when you receive this letter to schedule your follow up appointment.  

## 2012-11-12 ENCOUNTER — Other Ambulatory Visit: Payer: Self-pay | Admitting: Cardiology

## 2012-11-12 ENCOUNTER — Other Ambulatory Visit: Payer: Self-pay | Admitting: Internal Medicine

## 2012-11-23 ENCOUNTER — Other Ambulatory Visit: Payer: Self-pay | Admitting: Cardiology

## 2012-12-22 ENCOUNTER — Other Ambulatory Visit (INDEPENDENT_AMBULATORY_CARE_PROVIDER_SITE_OTHER): Payer: Medicare Other

## 2012-12-22 ENCOUNTER — Ambulatory Visit (INDEPENDENT_AMBULATORY_CARE_PROVIDER_SITE_OTHER): Payer: Medicare Other | Admitting: Internal Medicine

## 2012-12-22 ENCOUNTER — Encounter: Payer: Self-pay | Admitting: Internal Medicine

## 2012-12-22 VITALS — BP 138/76 | HR 63 | Temp 98.6°F | Resp 16 | Wt 191.0 lb

## 2012-12-22 DIAGNOSIS — I10 Essential (primary) hypertension: Secondary | ICD-10-CM

## 2012-12-22 DIAGNOSIS — R7309 Other abnormal glucose: Secondary | ICD-10-CM

## 2012-12-22 DIAGNOSIS — E119 Type 2 diabetes mellitus without complications: Secondary | ICD-10-CM | POA: Insufficient documentation

## 2012-12-22 LAB — BASIC METABOLIC PANEL WITH GFR
BUN: 18 mg/dL (ref 6–23)
CO2: 27 meq/L (ref 19–32)
Calcium: 9.8 mg/dL (ref 8.4–10.5)
Chloride: 108 meq/L (ref 96–112)
Creatinine, Ser: 1.4 mg/dL (ref 0.4–1.5)
GFR: 62.15 mL/min
Glucose, Bld: 127 mg/dL — ABNORMAL HIGH (ref 70–99)
Potassium: 4.6 meq/L (ref 3.5–5.1)
Sodium: 141 meq/L (ref 135–145)

## 2012-12-22 LAB — URINALYSIS, ROUTINE W REFLEX MICROSCOPIC
Bilirubin Urine: NEGATIVE
Hgb urine dipstick: NEGATIVE
Ketones, ur: NEGATIVE
Leukocytes, UA: NEGATIVE
Nitrite: NEGATIVE
RBC / HPF: NONE SEEN
Specific Gravity, Urine: 1.025
Total Protein, Urine: NEGATIVE
Urine Glucose: NEGATIVE
Urobilinogen, UA: 0.2
WBC, UA: NONE SEEN
pH: 5.5 (ref 5.0–8.0)

## 2012-12-22 NOTE — Progress Notes (Signed)
Subjective:    Patient ID: Tony [name], male    DOB: 06-Oct-1940, 72 y.o.   MRN: [id number]  Hypertension This is a chronic problem. The current episode started more than 1 year ago. The problem has been gradually improving since onset. The problem is controlled. Pertinent negatives include no anxiety, blurred vision, chest pain, headaches, malaise/fatigue, neck pain, orthopnea, palpitations, peripheral edema, PND, shortness of breath or sweats. Past treatments include angiotensin blockers, calcium channel blockers and beta blockers. The current treatment provides moderate improvement. Compliance problems include diet.  Hypertensive end-organ damage includes kidney disease. Identifiable causes of hypertension include chronic renal disease.      Review of Systems  Constitutional: Positive for unexpected weight change (some weight gain). Negative for fever, chills, malaise/fatigue, diaphoresis, appetite change and fatigue.  HENT: Negative.  Negative for neck pain.   Eyes: Negative.  Negative for blurred vision.  Respiratory: Negative.  Negative for apnea, cough, choking, chest tightness, shortness of breath, wheezing and stridor.   Cardiovascular: Negative.  Negative for chest pain, palpitations, orthopnea, leg swelling and PND.  Gastrointestinal: Negative.  Negative for nausea, vomiting, abdominal pain, diarrhea and constipation.  Endocrine: Negative.   Genitourinary: Negative.   Musculoskeletal: Negative.  Negative for myalgias, back pain, joint swelling, arthralgias and gait problem.  Skin: Negative.   Allergic/Immunologic: Negative.   Neurological: Negative.  Negative for dizziness, tremors, weakness, light-headedness and headaches.  Hematological: Negative.  Negative for adenopathy. Does not bruise/bleed easily.  Psychiatric/Behavioral: Negative.        Objective:   Physical Exam  Vitals reviewed. Constitutional: He is oriented to person, place, and time. He appears well-developed  and well-nourished. No distress.  HENT:  Head: Normocephalic and atraumatic.  Mouth/Throat: Oropharynx is clear and moist. No oropharyngeal exudate.  Eyes: Conjunctivae are normal. Right eye exhibits no discharge. Left eye exhibits no discharge. No scleral icterus.  Neck: Normal range of motion. Neck supple. No JVD present. No tracheal deviation present. No thyromegaly present.  Cardiovascular: Normal rate, regular rhythm, normal heart sounds and intact distal pulses.  Exam reveals no gallop and no friction rub.   No murmur heard. Pulmonary/Chest: Effort normal and breath sounds normal. No stridor. No respiratory distress. He has no wheezes. He has no rales. He exhibits no tenderness.  Abdominal: Soft. Bowel sounds are normal. He exhibits no distension and no mass. There is no tenderness. There is no rebound and no guarding.  Musculoskeletal: Normal range of motion. He exhibits no edema and no tenderness.  Lymphadenopathy:    He has no cervical adenopathy.  Neurological: He is oriented to person, place, and time.  Skin: Skin is warm and dry. No rash noted. He is not diaphoretic. No erythema. No pallor.  Psychiatric: He has a normal mood and affect. His behavior is normal. Judgment and thought content normal.     Lab Results  Component Value Date   WBC 7.2 06/24/2012   HGB 14.4 06/24/2012   HCT 43.3 06/24/2012   PLT 182.0 06/24/2012   GLUCOSE 188* 06/24/2012   CHOL 152 06/24/2012   TRIG 150.0* 06/24/2012   HDL 32.80* 06/24/2012   LDLCALC 89 06/24/2012   ALT 35 06/24/2012   AST 28 06/24/2012   NA 139 06/24/2012   K 4.3 06/24/2012   CL 105 06/24/2012   CREATININE 1.6* 06/24/2012   BUN 22 06/24/2012   CO2 27 06/24/2012   TSH 1.19 06/24/2012   PSA 0.85 06/24/2012   INR 1.2 09/20/2008   HGBA1C 5.0 12/21/2011  Assessment & Plan:

## 2012-12-22 NOTE — Patient Instructions (Signed)

## 2012-12-23 ENCOUNTER — Encounter: Payer: Self-pay | Admitting: Internal Medicine

## 2012-12-23 NOTE — Assessment & Plan Note (Signed)
His BP is well controlled. He has mild stable renal dysfunction. I have asked him to improve on his lifestyle modifications with diet, exercise, and weight loss.

## 2012-12-23 NOTE — Assessment & Plan Note (Signed)
I will check his A1C to see if he has developed DM2 

## 2012-12-23 NOTE — Assessment & Plan Note (Signed)
This is stable Will continue with BP control and will avoid nephrotoxic agents

## 2013-03-01 LAB — HM DIABETES EYE EXAM

## 2013-04-25 ENCOUNTER — Other Ambulatory Visit (INDEPENDENT_AMBULATORY_CARE_PROVIDER_SITE_OTHER): Payer: Medicare Other

## 2013-04-25 ENCOUNTER — Encounter: Payer: Self-pay | Admitting: Internal Medicine

## 2013-04-25 ENCOUNTER — Ambulatory Visit (INDEPENDENT_AMBULATORY_CARE_PROVIDER_SITE_OTHER): Payer: Medicare Other | Admitting: Internal Medicine

## 2013-04-25 VITALS — BP 148/88 | HR 66 | Temp 98.5°F | Resp 16 | Ht 69.0 in | Wt 191.2 lb

## 2013-04-25 DIAGNOSIS — Z23 Encounter for immunization: Secondary | ICD-10-CM

## 2013-04-25 DIAGNOSIS — R7309 Other abnormal glucose: Secondary | ICD-10-CM

## 2013-04-25 DIAGNOSIS — I10 Essential (primary) hypertension: Secondary | ICD-10-CM

## 2013-04-25 LAB — BASIC METABOLIC PANEL
BUN: 19 mg/dL (ref 6–23)
CO2: 23 mEq/L (ref 19–32)
Calcium: 9.7 mg/dL (ref 8.4–10.5)
Creatinine, Ser: 1.3 mg/dL (ref 0.4–1.5)
Glucose, Bld: 144 mg/dL — ABNORMAL HIGH (ref 70–99)

## 2013-04-25 NOTE — Patient Instructions (Signed)

## 2013-04-25 NOTE — Progress Notes (Signed)
Subjective:    Patient ID: Tony [name], male    DOB: Nov 20, 1940, 72 y.o.   MRN: [id number]  Hypertension This is a chronic problem. The current episode started more than 1 year ago. The problem is unchanged. The problem is controlled. Pertinent negatives include no anxiety, blurred vision, chest pain, headaches, malaise/fatigue, neck pain, orthopnea, palpitations, peripheral edema, PND, shortness of breath or sweats. Risk factors for coronary artery disease include obesity. Past treatments include calcium channel blockers, angiotensin blockers and beta blockers. The current treatment provides moderate improvement. Compliance problems include exercise and diet.       Review of Systems  Constitutional: Negative.  Negative for fever, chills, malaise/fatigue, diaphoresis, appetite change and fatigue.  HENT: Negative.   Eyes: Negative.  Negative for blurred vision.  Respiratory: Negative.  Negative for cough, choking, chest tightness, shortness of breath, wheezing and stridor.   Cardiovascular: Negative.  Negative for chest pain, palpitations, orthopnea, leg swelling and PND.  Gastrointestinal: Negative.  Negative for nausea, vomiting, abdominal pain, diarrhea, constipation and blood in stool.  Endocrine: Negative.  Negative for polydipsia, polyphagia and polyuria.  Genitourinary: Negative.   Musculoskeletal: Negative.  Negative for neck pain.  Skin: Negative.   Allergic/Immunologic: Negative.   Neurological: Negative.  Negative for dizziness, tremors, syncope, speech difficulty, weakness, light-headedness, numbness and headaches.  Hematological: Negative.  Negative for adenopathy. Does not bruise/bleed easily.  Psychiatric/Behavioral: Negative.        Objective:   Physical Exam  Vitals reviewed. Constitutional: He is oriented to person, place, and time. He appears well-developed and well-nourished. No distress.  HENT:  Head: Normocephalic and atraumatic.  Mouth/Throat: Oropharynx is  clear and moist. No oropharyngeal exudate.  Eyes: Conjunctivae are normal. Right eye exhibits no discharge. Left eye exhibits no discharge. No scleral icterus.  Neck: Normal range of motion. Neck supple. No JVD present. No tracheal deviation present. No thyromegaly present.  Cardiovascular: Normal rate, regular rhythm, normal heart sounds and intact distal pulses.  Exam reveals no gallop and no friction rub.   No murmur heard. Pulmonary/Chest: Effort normal and breath sounds normal. No stridor. No respiratory distress. He has no wheezes. He has no rales. He exhibits no tenderness.  Abdominal: Soft. Bowel sounds are normal. He exhibits no distension and no mass. There is no tenderness. There is no rebound and no guarding.  Musculoskeletal: Normal range of motion. He exhibits edema (trace edema in BLE). He exhibits no tenderness.  Lymphadenopathy:    He has no cervical adenopathy.  Neurological: He is oriented to person, place, and time.  Skin: Skin is warm and dry. No rash noted. He is not diaphoretic. No erythema. No pallor.  Psychiatric: He has a normal mood and affect. His behavior is normal. Judgment and thought content normal.     Lab Results  Component Value Date   WBC 7.2 06/24/2012   HGB 14.4 06/24/2012   HCT 43.3 06/24/2012   PLT 182.0 06/24/2012   GLUCOSE 127* 12/22/2012   CHOL 152 06/24/2012   TRIG 150.0* 06/24/2012   HDL 32.80* 06/24/2012   LDLCALC 89 06/24/2012   ALT 35 06/24/2012   AST 28 06/24/2012   NA 141 12/22/2012   K 4.6 12/22/2012   CL 108 12/22/2012   CREATININE 1.4 12/22/2012   BUN 18 12/22/2012   CO2 27 12/22/2012   TSH 1.19 06/24/2012   PSA 0.85 06/24/2012   INR 1.2 09/20/2008   HGBA1C 5.6 12/22/2012       Assessment & Plan:

## 2013-04-25 NOTE — Progress Notes (Signed)
Pre visit review using our clinic review tool, if applicable. No additional management support is needed unless otherwise documented below in the visit note. 

## 2013-04-25 NOTE — Assessment & Plan Note (Signed)
He has adequate BP control, I would like for it to be lower but I don't think he needs another med He will work more diligently on his lifestyle modifications Today I will check his lytes and renal function

## 2013-04-25 NOTE — Assessment & Plan Note (Signed)
I will check his A1C to see if he has developed DM2 

## 2013-05-21 ENCOUNTER — Encounter (HOSPITAL_COMMUNITY): Payer: Self-pay | Admitting: Emergency Medicine

## 2013-05-21 ENCOUNTER — Emergency Department (HOSPITAL_COMMUNITY)
Admission: EM | Admit: 2013-05-21 | Discharge: 2013-05-21 | Disposition: A | Discharge status: Home / Self Care | Payer: Medicare HMO | Attending: Emergency Medicine | Admitting: Emergency Medicine

## 2013-05-21 ENCOUNTER — Emergency Department (HOSPITAL_COMMUNITY): Payer: Medicare HMO

## 2013-05-21 DIAGNOSIS — M545 Low back pain, unspecified: Secondary | ICD-10-CM | POA: Insufficient documentation

## 2013-05-21 DIAGNOSIS — E785 Hyperlipidemia, unspecified: Secondary | ICD-10-CM | POA: Insufficient documentation

## 2013-05-21 DIAGNOSIS — X503XXA Overexertion from repetitive movements, initial encounter: Secondary | ICD-10-CM | POA: Insufficient documentation

## 2013-05-21 DIAGNOSIS — Z79899 Other long term (current) drug therapy: Secondary | ICD-10-CM | POA: Insufficient documentation

## 2013-05-21 DIAGNOSIS — M549 Dorsalgia, unspecified: Secondary | ICD-10-CM

## 2013-05-21 DIAGNOSIS — Y92009 Unspecified place in unspecified non-institutional (private) residence as the place of occurrence of the external cause: Secondary | ICD-10-CM | POA: Insufficient documentation

## 2013-05-21 DIAGNOSIS — Z87891 Personal history of nicotine dependence: Secondary | ICD-10-CM | POA: Insufficient documentation

## 2013-05-21 DIAGNOSIS — Z8673 Personal history of transient ischemic attack (TIA), and cerebral infarction without residual deficits: Secondary | ICD-10-CM | POA: Insufficient documentation

## 2013-05-21 DIAGNOSIS — E119 Type 2 diabetes mellitus without complications: Secondary | ICD-10-CM | POA: Insufficient documentation

## 2013-05-21 DIAGNOSIS — Z7982 Long term (current) use of aspirin: Secondary | ICD-10-CM | POA: Insufficient documentation

## 2013-05-21 DIAGNOSIS — IMO0002 Reserved for concepts with insufficient information to code with codable children: Secondary | ICD-10-CM | POA: Insufficient documentation

## 2013-05-21 DIAGNOSIS — M25559 Pain in unspecified hip: Secondary | ICD-10-CM | POA: Insufficient documentation

## 2013-05-21 DIAGNOSIS — I10 Essential (primary) hypertension: Secondary | ICD-10-CM | POA: Insufficient documentation

## 2013-05-21 DIAGNOSIS — Y93H1 Activity, digging, shoveling and raking: Secondary | ICD-10-CM | POA: Insufficient documentation

## 2013-05-21 MED ORDER — TRAMADOL HCL 50 MG PO TABS: 50.0000 mg | ORAL_TABLET | Freq: Four times a day (QID) | ORAL | Status: DC | PRN

## 2013-05-21 MED ORDER — TRAMADOL HCL 50 MG PO TABS
50.0000 mg | ORAL_TABLET | Freq: Once | ORAL | Status: AC
Start: 2013-05-21 — End: 2013-05-21
  Administered 2013-05-21: 50 mg via ORAL
  Filled 2013-05-21: qty 1

## 2013-05-21 NOTE — Discharge Instructions (Signed)
Back Pain, Adult Low back pain is very common. About 1 in 5 people have back pain.The cause of low back pain is rarely dangerous. The pain often gets better over time.About half of people with a sudden onset of back pain feel better in just 2 weeks. About 8 in 10 people feel better by 6 weeks.  CAUSES Some common causes of back pain include:  Strain of the muscles or ligaments supporting the spine.  Wear and tear (degeneration) of the spinal discs.  Arthritis.  Direct injury to the back. DIAGNOSIS Most of the time, the direct cause of low back pain is not known.However, back pain can be treated effectively even when the exact cause of the pain is unknown.Answering your caregiver's questions about your overall health and symptoms is one of the most accurate ways to make sure the cause of your pain is not dangerous. If your caregiver needs more information, he or she may order lab work or imaging tests (X-rays or MRIs).However, even if imaging tests show changes in your back, this usually does not require surgery. HOME CARE INSTRUCTIONS For many people, back pain returns.Since low back pain is rarely dangerous, it is often a condition that people can learn to manageon their own.   Remain active. It is stressful on the back to sit or stand in one place. Do not sit, drive, or stand in one place for more than 30 minutes at a time. Take short walks on level surfaces as soon as pain allows.Try to increase the length of time you walk each day.  Do not stay in bed.Resting more than 1 or 2 days can delay your recovery.  Do not avoid exercise or work.Your body is made to move.It is not dangerous to be active, even though your back may hurt.Your back will likely heal faster if you return to being active before your pain is gone.  Pay attention to your body when you bend and lift. Many people have less discomfortwhen lifting if they bend their knees, keep the load close to their bodies,and  avoid twisting. Often, the most comfortable positions are those that put less stress on your recovering back.  Find a comfortable position to sleep. Use a firm mattress and lie on your side with your knees slightly bent. If you lie on your back, put a pillow under your knees.  Only take over-the-counter or prescription medicines as directed by your caregiver. Over-the-counter medicines to reduce pain and inflammation are often the most helpful.Your caregiver may prescribe muscle relaxant drugs.These medicines help dull your pain so you can more quickly return to your normal activities and healthy exercise.  Put ice on the injured area.  Put ice in a plastic bag.  Place a towel between your skin and the bag.  Leave the ice on for 15-20 minutes, 03-04 times a day for the first 2 to 3 days. After that, ice and heat may be alternated to reduce pain and spasms.  Ask your caregiver about trying back exercises and gentle massage. This may be of some benefit.  Avoid feeling anxious or stressed.Stress increases muscle tension and can worsen back pain.It is important to recognize when you are anxious or stressed and learn ways to manage it.Exercise is a great option. SEEK MEDICAL CARE IF:  You have pain that is not relieved with rest or medicine.  You have pain that does not improve in 1 week.  You have new symptoms.  You are generally not feeling well. SEEK   IMMEDIATE MEDICAL CARE IF:   You have pain that radiates from your back into your legs.  You develop new bowel or bladder control problems.  You have unusual weakness or numbness in your arms or legs.  You develop nausea or vomiting.  You develop abdominal pain.  You feel faint. Document Released: 05/04/2005 Document Revised: 11/03/2011 Document Reviewed: 09/22/2010 [employer] Patient Information 2014 [employer].  

## 2013-05-21 NOTE — ED Notes (Signed)
The pt is c/o lower back pain  Since he was raking leaves one week ago.  The pain continues
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
## 2013-05-21 NOTE — ED Notes (Signed)
Pt transported to xray 

## 2013-05-24 ENCOUNTER — Ambulatory Visit (INDEPENDENT_AMBULATORY_CARE_PROVIDER_SITE_OTHER): Payer: Medicare HMO | Admitting: Internal Medicine

## 2013-05-24 ENCOUNTER — Encounter: Payer: Self-pay | Admitting: Internal Medicine

## 2013-05-24 ENCOUNTER — Telehealth: Payer: Self-pay

## 2013-05-24 VITALS — BP 148/90 | HR 63 | Temp 97.5°F | Resp 16 | Ht 69.0 in | Wt 191.5 lb

## 2013-05-24 DIAGNOSIS — M25559 Pain in unspecified hip: Secondary | ICD-10-CM | POA: Insufficient documentation

## 2013-05-24 DIAGNOSIS — M169 Osteoarthritis of hip, unspecified: Secondary | ICD-10-CM | POA: Insufficient documentation

## 2013-05-24 DIAGNOSIS — M25552 Pain in left hip: Secondary | ICD-10-CM

## 2013-05-24 DIAGNOSIS — M1610 Unilateral primary osteoarthritis, unspecified hip: Secondary | ICD-10-CM

## 2013-05-24 MED ORDER — HYDROCODONE-ACETAMINOPHEN 5-325 MG PO TABS: 1.0000 | ORAL_TABLET | Freq: Four times a day (QID) | ORAL | Status: DC | PRN

## 2013-05-24 NOTE — Progress Notes (Signed)
Pre visit review using our clinic review tool, if applicable. No additional management support is needed unless otherwise documented below in the visit note. 

## 2013-05-24 NOTE — Telephone Encounter (Signed)
[facility] to notify 

## 2013-05-24 NOTE — Patient Instructions (Signed)
Hip Pain  The hips join the upper legs to the lower pelvis. The bones, cartilage, tendons, and muscles of the hip joint perform a lot of work each day holding your body weight and allowing you to move around.  Hip pain is a common symptom. It can range from a minor ache to severe pain on 1 or both hips. Pain may be felt on the inside of the hip joint near the groin, or the outside near the buttocks and upper thigh. There may be swelling or stiffness as well. It occurs more often when a person walks or performs activity. There are many reasons hip pain can develop.  CAUSES   It is important to work with your caregiver to identify the cause since many conditions can impact the bones, cartilage, muscles, and tendons of the hips. Causes for hip pain include:   Broken (fractured) bones.   Separation of the thighbone from the hip socket (dislocation).   Torn cartilage of the hip joint.   Swelling (inflammation) of a tendon (tendonitis), the sac within the hip joint (bursitis), or a joint.   A weakening in the abdominal wall (hernia), affecting the nerves to the hip.   Arthritis in the hip joint or lining of the hip joint.   Pinched nerves in the back, hip, or upper thigh.   A bulging disc in the spine (herniated disc).   Rarely, bone infection or cancer.  DIAGNOSIS   The location of your hip pain will help your caregiver understand what may be causing the pain. A diagnosis is based on your medical history, your symptoms, results from your physical exam, and results from diagnostic tests. Diagnostic tests may include X-ray exams, a computerized magnetic scan (magnetic resonance imaging, MRI), or bone scan.  TREATMENT   Treatment will depend on the cause of your hip pain. Treatment may include:   Limiting activities and resting until symptoms improve.   Crutches or other walking supports (a cane or brace).   Ice, elevation, and compression.   Physical therapy or home exercises.   Shoe inserts or special  shoes.   Losing weight.   Medications to reduce pain.   Undergoing surgery.  HOME CARE INSTRUCTIONS    Only take over-the-counter or prescription medicines for pain, discomfort, or fever as directed by your caregiver.   Put ice on the injured area:   Put ice in a plastic bag.   Place a towel between your skin and the bag.   Leave the ice on for 15-20 minutes at a time, 03-04 times a day.   Keep your leg raised (elevated) when possible to lessen swelling.   Avoid activities that cause pain.   Follow specific exercises as directed by your caregiver.   Sleep with a pillow between your legs on your most comfortable side.   Record how often you have hip pain, the location of the pain, and what it feels like. This information may be helpful to you and your caregiver.   Ask your caregiver about returning to work or sports and whether you should drive.   Follow up with your caregiver for further exams, therapy, or testing as directed.  SEEK MEDICAL CARE IF:    Your pain or swelling continues or worsens after 1 week.   You are feeling unwell or have chills.   You have increasing difficulty with walking.   You have a loss of sensation or other new symptoms.   You have questions or concerns.  SEEK   IMMEDIATE MEDICAL CARE IF:    You cannot put weight on the affected hip.   You have fallen.   You have a sudden increase in pain and swelling in your hip.   You have a fever.  MAKE SURE YOU:    Understand these instructions.   Will watch your condition.   Will get help right away if you are not doing well or get worse.  Document Released: 10/22/2009 Document Revised: 07/27/2011 Document Reviewed: 10/22/2009  [employer] Patient Information 2014 [employer].

## 2013-05-24 NOTE — Telephone Encounter (Signed)
Will change to a CT scan
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
## 2013-05-24 NOTE — Progress Notes (Signed)
Subjective:    Patient ID: Tony [name], male    DOB: 12/29/1940, 73 y.o.   MRN: [id number]  Hip Pain  The incident occurred 3 to 5 days ago. The incident occurred at home. There was no injury mechanism. The pain is present in the left hip. The quality of the pain is described as aching. The pain is at a severity of 4/10. The pain is moderate. The pain has been fluctuating since onset. Pertinent negatives include no inability to bear weight, loss of motion, loss of sensation, muscle weakness, numbness or tingling. He reports no foreign bodies present. The symptoms are aggravated by movement. He has tried NSAIDs (tramadol did not help much) for the symptoms. The treatment provided no relief.      Review of Systems  Constitutional: Negative.  Negative for fever, chills, diaphoresis, appetite change and fatigue.  HENT: Negative.   Eyes: Negative.   Respiratory: Negative.  Negative for cough, choking, chest tightness, shortness of breath, wheezing and stridor.   Cardiovascular: Negative.  Negative for chest pain, palpitations and leg swelling.  Gastrointestinal: Negative.  Negative for nausea, abdominal pain, diarrhea, constipation and blood in stool.  Endocrine: Negative.   Genitourinary: Negative.  Negative for dysuria, urgency, frequency, hematuria, flank pain, decreased urine volume, discharge, penile swelling, scrotal swelling, difficulty urinating, penile pain and testicular pain.  Musculoskeletal: Positive for arthralgias. Negative for back pain, gait problem, joint swelling, myalgias, neck pain and neck stiffness.  Skin: Negative.   Allergic/Immunologic: Negative.   Neurological: Negative.  Negative for tingling and numbness.  Hematological: Negative.  Negative for adenopathy. Does not bruise/bleed easily.  Psychiatric/Behavioral: Negative.        Objective:   Physical Exam  Vitals reviewed. Constitutional: He is oriented to person, place, and time. He appears well-developed and  well-nourished. No distress.  HENT:  Head: Normocephalic and atraumatic.  Mouth/Throat: Oropharynx is clear and moist. No oropharyngeal exudate.  Eyes: Conjunctivae are normal. Right eye exhibits no discharge. Left eye exhibits no discharge. No scleral icterus.  Neck: Normal range of motion. Neck supple. No JVD present. No tracheal deviation present. No thyromegaly present.  Cardiovascular: Normal rate, regular rhythm, normal heart sounds and intact distal pulses.  Exam reveals no gallop and no friction rub.   No murmur heard. Pulmonary/Chest: Effort normal and breath sounds normal. No stridor. No respiratory distress. He has no wheezes. He has no rales. He exhibits no tenderness.  Abdominal: Soft. Bowel sounds are normal. He exhibits no distension and no mass. There is no tenderness. There is no rebound and no guarding. Hernia confirmed negative in the right inguinal area and confirmed negative in the left inguinal area.  Genitourinary: Penis normal. Right testis shows no mass, no swelling and no tenderness. Right testis is descended. Cremasteric reflex is not absent on the right side. Left testis shows no mass, no swelling and no tenderness. Left testis is descended. Cremasteric reflex is not absent on the left side. Uncircumcised. No phimosis, paraphimosis, hypospadias, penile erythema or penile tenderness. No discharge found.  Musculoskeletal: He exhibits no edema and no tenderness.       Left hip: He exhibits decreased range of motion. He exhibits normal strength, no tenderness, no bony tenderness, no swelling, no crepitus, no deformity and no laceration.  Lymphadenopathy:    He has no cervical adenopathy.       Right: No inguinal adenopathy present.       Left: No inguinal adenopathy present.  Neurological: He is alert and  oriented to person, place, and time. He has normal reflexes. He displays normal reflexes. No cranial nerve deficit. He exhibits normal muscle tone. Coordination normal.    Skin: Skin is warm and dry. No rash noted. He is not diaphoretic. No erythema. No pallor.          Assessment & Plan:

## 2013-05-25 ENCOUNTER — Encounter: Payer: Self-pay | Admitting: Internal Medicine

## 2013-05-25 NOTE — Assessment & Plan Note (Signed)
He will try norco for the pain 
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
## 2013-06-01 ENCOUNTER — Telehealth: Payer: Self-pay | Admitting: *Deleted

## 2013-06-01 ENCOUNTER — Other Ambulatory Visit: Payer: Self-pay | Admitting: Internal Medicine

## 2013-06-01 DIAGNOSIS — M169 Osteoarthritis of hip, unspecified: Secondary | ICD-10-CM

## 2013-06-01 DIAGNOSIS — M25552 Pain in left hip: Secondary | ICD-10-CM

## 2013-06-01 NOTE — Telephone Encounter (Signed)
Tony [name] (from CT called to check if it is ok to do left ct without contrast? Tony [name] said that you do not have to use contrast for this scan. Please advise?

## 2013-06-01 NOTE — Telephone Encounter (Signed)
Tony [name] was informed.

## 2013-06-01 NOTE — Telephone Encounter (Signed)
Yes, that is okay.

## 2013-06-02 ENCOUNTER — Ambulatory Visit (INDEPENDENT_AMBULATORY_CARE_PROVIDER_SITE_OTHER)
Admission: RE | Admit: 2013-06-02 | Discharge: 2013-06-02 | Disposition: A | Discharge status: Home / Self Care | Payer: Medicare HMO | Source: Ambulatory Visit | Attending: Internal Medicine | Admitting: Internal Medicine

## 2013-06-02 DIAGNOSIS — M169 Osteoarthritis of hip, unspecified: Secondary | ICD-10-CM

## 2013-06-02 DIAGNOSIS — M1610 Unilateral primary osteoarthritis, unspecified hip: Secondary | ICD-10-CM

## 2013-06-02 DIAGNOSIS — M25552 Pain in left hip: Secondary | ICD-10-CM

## 2013-06-02 DIAGNOSIS — M25559 Pain in unspecified hip: Secondary | ICD-10-CM

## 2013-06-26 ENCOUNTER — Ambulatory Visit (INDEPENDENT_AMBULATORY_CARE_PROVIDER_SITE_OTHER): Payer: Medicare HMO | Admitting: Internal Medicine

## 2013-06-26 ENCOUNTER — Encounter: Payer: Self-pay | Admitting: Internal Medicine

## 2013-06-26 VITALS — BP 130/88 | HR 80 | Temp 98.7°F | Resp 16 | Ht 69.0 in | Wt 186.0 lb

## 2013-06-26 DIAGNOSIS — G5620 Lesion of ulnar nerve, unspecified upper limb: Secondary | ICD-10-CM

## 2013-06-26 DIAGNOSIS — R7309 Other abnormal glucose: Secondary | ICD-10-CM

## 2013-06-26 DIAGNOSIS — E1165 Type 2 diabetes mellitus with hyperglycemia: Secondary | ICD-10-CM

## 2013-06-26 DIAGNOSIS — M169 Osteoarthritis of hip, unspecified: Secondary | ICD-10-CM

## 2013-06-26 DIAGNOSIS — E1129 Type 2 diabetes mellitus with other diabetic kidney complication: Secondary | ICD-10-CM

## 2013-06-26 DIAGNOSIS — G5622 Lesion of ulnar nerve, left upper limb: Secondary | ICD-10-CM | POA: Insufficient documentation

## 2013-06-26 DIAGNOSIS — M1610 Unilateral primary osteoarthritis, unspecified hip: Secondary | ICD-10-CM

## 2013-06-26 DIAGNOSIS — I10 Essential (primary) hypertension: Secondary | ICD-10-CM

## 2013-06-26 MED ORDER — HYDROCODONE-ACETAMINOPHEN 5-325 MG PO TABS: 1.0000 | ORAL_TABLET | Freq: Four times a day (QID) | ORAL | Status: DC | PRN

## 2013-06-26 NOTE — Progress Notes (Signed)
Subjective:    Patient ID: Tony [name], male    DOB: [birth year], 73 y.o.   MRN: [id number]  HPI Comments: Today he complains of a 2 week history of left elbow pain with numbness in his left hand over the 4th and 5th fingers. There was no trauma or injury.  Diabetes He presents for his initial diabetic visit. He has type 2 diabetes mellitus. His disease course has been stable. There are no hypoglycemic associated symptoms. Pertinent negatives for hypoglycemia include no dizziness, headaches, seizures, speech difficulty or tremors. Pertinent negatives for diabetes include no blurred vision, no chest pain, no fatigue, no foot paresthesias, no foot ulcerations, no polydipsia, no polyphagia, no polyuria, no visual change, no weakness and no weight loss. There are no hypoglycemic complications. There are no diabetic complications. His weight is stable. He is following a generally healthy diet. Meal planning includes avoidance of concentrated sweets. He participates in exercise daily. An ACE inhibitor/angiotensin II receptor blocker is being taken. He does not see a podiatrist.Eye exam is not current.      Review of Systems  Constitutional: Negative.  Negative for fever, chills, weight loss, diaphoresis, appetite change and fatigue.  HENT: Negative.   Eyes: Negative.  Negative for blurred vision.  Respiratory: Negative for cough, choking, chest tightness, shortness of breath, wheezing and stridor.   Cardiovascular: Negative.  Negative for chest pain, palpitations and leg swelling.  Gastrointestinal: Negative.  Negative for nausea, vomiting, abdominal pain, diarrhea, constipation and blood in stool.  Endocrine: Negative.  Negative for polydipsia, polyphagia and polyuria.  Genitourinary: Negative.   Musculoskeletal: Positive for arthralgias. Negative for gait problem, joint swelling, myalgias, neck pain and neck stiffness.  Skin: Negative.   Allergic/Immunologic: Negative.   Neurological: Positive  for numbness. Negative for dizziness, tremors, seizures, syncope, facial asymmetry, speech difficulty, weakness, light-headedness and headaches.  Hematological: Negative.  Negative for adenopathy. Does not bruise/bleed easily.  Psychiatric/Behavioral: Negative.        Objective:   Physical Exam  Vitals reviewed. Constitutional: He is oriented to person, place, and time. He appears well-developed and well-nourished. No distress.  HENT:  Head: Normocephalic and atraumatic.  Mouth/Throat: Oropharynx is clear and moist. No oropharyngeal exudate.  Eyes: Conjunctivae are normal. Right eye exhibits no discharge. Left eye exhibits no discharge. No scleral icterus.  Neck: Normal range of motion. Neck supple. No JVD present. No tracheal deviation present. No thyromegaly present.  Cardiovascular: Normal rate, regular rhythm, normal heart sounds and intact distal pulses.  Exam reveals no gallop and no friction rub.   No murmur heard. Pulmonary/Chest: Effort normal and breath sounds normal. No stridor. No respiratory distress. He has no wheezes. He has no rales. He exhibits no tenderness.  Abdominal: Soft. Bowel sounds are normal. He exhibits no distension and no mass. There is no tenderness. There is no rebound and no guarding.  Musculoskeletal: Normal range of motion. He exhibits no edema and no tenderness.       Left elbow: Normal. He exhibits normal range of motion, no swelling, no effusion, no deformity and no laceration. No tenderness found. No radial head, no medial epicondyle, no lateral epicondyle and no olecranon process tenderness noted.       Left hand: Normal. He exhibits normal range of motion, no tenderness, no bony tenderness, normal two-point discrimination, normal capillary refill, no deformity, no laceration and no swelling. Normal sensation noted. Decreased sensation is not present in the ulnar distribution, is not present in the medial redistribution and is  not present in the radial  distribution. Normal strength noted. He exhibits no finger abduction, no thumb/finger opposition and no wrist extension trouble.  Lymphadenopathy:    He has no cervical adenopathy.  Neurological: He is alert and oriented to person, place, and time. He has normal reflexes. He displays normal reflexes. No cranial nerve deficit. He exhibits normal muscle tone. Coordination normal.  Skin: Skin is warm and dry. No rash noted. He is not diaphoretic. No erythema. No pallor.     Lab Results  Component Value Date   WBC 7.2 06/24/2012   HGB 14.4 06/24/2012   HCT 43.3 06/24/2012   PLT 182.0 06/24/2012   GLUCOSE 144* 04/25/2013   CHOL 152 06/24/2012   TRIG 150.0* 06/24/2012   HDL 32.80* 06/24/2012   LDLCALC 89 06/24/2012   ALT 35 06/24/2012   AST 28 06/24/2012   NA 134* 04/25/2013   K 4.2 04/25/2013   CL 104 04/25/2013   CREATININE 1.3 04/25/2013   BUN 19 04/25/2013   CO2 23 04/25/2013   TSH 1.19 06/24/2012   PSA 0.85 06/24/2012   INR 1.2 09/20/2008   HGBA1C 6.7* 04/25/2013       Assessment & Plan:

## 2013-06-26 NOTE — Assessment & Plan Note (Signed)
I have referred him for an eye exam He will cont to work on his lifestyle modifications

## 2013-06-26 NOTE — Patient Instructions (Signed)
Type 2 Diabetes Mellitus, Adult Type 2 diabetes mellitus, often simply referred to as type 2 diabetes, is a long-lasting (chronic) disease. In type 2 diabetes, the pancreas does not make enough insulin (a hormone), the cells are less responsive to the insulin that is made (insulin resistance), or both. Normally, insulin moves sugars from food into the tissue cells. The tissue cells use the sugars for energy. The lack of insulin or the lack of normal response to insulin causes excess sugars to build up in the blood instead of going into the tissue cells. As a result, high blood sugar (hyperglycemia) develops. The effect of high sugar (glucose) levels can cause many complications. Type 2 diabetes was also previously called adult-onset diabetes but it can occur at any age.  RISK FACTORS  A person is predisposed to developing type 2 diabetes if someone in the family has the disease and also has one or more of the following primary risk factors:  Overweight.  An inactive lifestyle.  A history of consistently eating high-calorie foods. Maintaining a normal weight and regular physical activity can reduce the chance of developing type 2 diabetes. SYMPTOMS  A person with type 2 diabetes may not show symptoms initially. The symptoms of type 2 diabetes appear slowly. The symptoms include:  Increased thirst (polydipsia).  Increased urination (polyuria).  Increased urination during the night (nocturia).  Weight loss. This weight loss may be rapid.  Frequent, recurring infections.  Tiredness (fatigue).  Weakness.  Vision changes, such as blurred vision.  Fruity smell to your breath.  Abdominal pain.  Nausea or vomiting.  Cuts or bruises which are slow to heal.  Tingling or numbness in the hands or feet. DIAGNOSIS Type 2 diabetes is frequently not diagnosed until complications of diabetes are present. Type 2 diabetes is diagnosed when symptoms or complications are present and when blood  glucose levels are increased. Your blood glucose level may be checked by one or more of the following blood tests:  A fasting blood glucose test. You will not be allowed to eat for at least 8 hours before a blood sample is taken.  A random blood glucose test. Your blood glucose is checked at any time of the day regardless of when you ate.  A hemoglobin A1c blood glucose test. A hemoglobin A1c test provides information about blood glucose control over the previous 3 months.  An oral glucose tolerance test (OGTT). Your blood glucose is measured after you have not eaten (fasted) for 2 hours and then after you drink a glucose-containing beverage. TREATMENT   You may need to take insulin or diabetes medicine daily to keep blood glucose levels in the desired range.  You will need to match insulin dosing with exercise and healthy food choices. The treatment goal is to maintain the before meal blood sugar (preprandial glucose) level at 70 130 mg/dL. HOME CARE INSTRUCTIONS   Have your hemoglobin A1c level checked twice a year.  Perform daily blood glucose monitoring as directed by your caregiver.  Monitor urine ketones when you are ill and as directed by your caregiver.  Take your diabetes medicine or insulin as directed by your caregiver to maintain your blood glucose levels in the desired range.  Never run out of diabetes medicine or insulin. It is needed every day.  Adjust insulin based on your intake of carbohydrates. Carbohydrates can raise blood glucose levels but need to be included in your diet. Carbohydrates provide vitamins, minerals, and fiber which are an essential part of   a healthy diet. Carbohydrates are found in fruits, vegetables, whole grains, dairy products, legumes, and foods containing added sugars.    Eat healthy foods. Alternate 3 meals with 3 snacks.  Lose weight if overweight.  Carry a medical alert card or wear your medical alert jewelry.  Carry a 15 gram  carbohydrate snack with you at all times to treat low blood glucose (hypoglycemia). Some examples of 15 gram carbohydrate snacks include:  Glucose tablets, 3 or 4   Glucose gel, 15 gram tube  Raisins, 2 tablespoons (24 grams)  Jelly beans, 6  Animal crackers, 8  Regular pop, 4 ounces (120 mL)  Gummy treats, 9  Recognize hypoglycemia. Hypoglycemia occurs with blood glucose levels of 70 mg/dL and below. The risk for hypoglycemia increases when fasting or skipping meals, during or after intense exercise, and during sleep. Hypoglycemia symptoms can include:  Tremors or shakes.  Decreased ability to concentrate.  Sweating.  Increased heart rate.  Headache.  Dry mouth.  Hunger.  Irritability.  Anxiety.  Restless sleep.  Altered speech or coordination.  Confusion.  Treat hypoglycemia promptly. If you are alert and able to safely swallow, follow the 15:15 rule:  Take 15 20 grams of rapid-acting glucose or carbohydrate. Rapid-acting options include glucose gel, glucose tablets, or 4 ounces (120 mL) of fruit juice, regular soda, or low fat milk.  Check your blood glucose level 15 minutes after taking the glucose.  Take 15 20 grams more of glucose if the repeat blood glucose level is still 70 mg/dL or below.  Eat a meal or snack within 1 hour once blood glucose levels return to normal.    Be alert to polyuria and polydipsia which are early signs of hyperglycemia. An early awareness of hyperglycemia allows for prompt treatment. Treat hyperglycemia as directed by your caregiver.  Engage in at least 150 minutes of moderate-intensity physical activity a week, spread over at least 3 days of the week or as directed by your caregiver. In addition, you should engage in resistance exercise at least 2 times a week or as directed by your caregiver.  Adjust your medicine and food intake as needed if you start a new exercise or sport.  Follow your sick day plan at any time you  are unable to eat or drink as usual.  Avoid tobacco use.  Limit alcohol intake to no more than 1 drink per day for nonpregnant women and 2 drinks per day for men. You should drink alcohol only when you are also eating food. Talk with your caregiver whether alcohol is safe for you. Tell your caregiver if you drink alcohol several times a week.  Follow up with your caregiver regularly.  Schedule an eye exam soon after the diagnosis of type 2 diabetes and then annually.  Perform daily skin and foot care. Examine your skin and feet daily for cuts, bruises, redness, nail problems, bleeding, blisters, or sores. A foot exam by a caregiver should be done annually.  Brush your teeth and gums at least twice a day and floss at least once a day. Follow up with your dentist regularly.  Share your diabetes management plan with your workplace or school.  Stay up-to-date with immunizations.  Learn to manage stress.  Obtain ongoing diabetes education and support as needed.  Participate in, or seek rehabilitation as needed to maintain or improve independence and quality of life. Request a physical or occupational therapy referral if you are having foot or hand numbness or difficulties with grooming,   dressing, eating, or physical activity. SEEK MEDICAL CARE IF:   You are unable to eat food or drink fluids for more than 6 hours.  You have nausea and vomiting for more than 6 hours.  Your blood glucose level is over 240 mg/dL.  There is a change in mental status.  You develop an additional serious illness.  You have diarrhea for more than 6 hours.  You have been sick or have had a fever for a couple of days and are not getting better.  You have pain during any physical activity.  SEEK IMMEDIATE MEDICAL CARE IF:  You have difficulty breathing.  You have moderate to large ketone levels. MAKE SURE YOU:  Understand these instructions.  Will watch your condition.  Will get help right away if  you are not doing well or get worse. Document Released: 05/04/2005 Document Revised: 01/27/2012 Document Reviewed: 12/01/2011 [employer] Patient Information 2014 [employer].  

## 2013-06-26 NOTE — Progress Notes (Signed)
Pre visit review using our clinic review tool, if applicable. No additional management support is needed unless otherwise documented below in the visit note. 
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
## 2013-06-26 NOTE — Assessment & Plan Note (Signed)
His BP is well controlled 

## 2013-06-29 ENCOUNTER — Telehealth: Payer: Self-pay

## 2013-06-29 NOTE — Telephone Encounter (Signed)
Relevant patient education mailed to patient.  

## 2013-07-08 ENCOUNTER — Other Ambulatory Visit: Payer: Self-pay | Admitting: Internal Medicine

## 2013-07-11 ENCOUNTER — Ambulatory Visit: Payer: Medicare HMO | Admitting: *Deleted

## 2013-07-19 ENCOUNTER — Ambulatory Visit: Payer: Medicare HMO | Attending: Internal Medicine | Admitting: *Deleted

## 2013-07-19 DIAGNOSIS — M2550 Pain in unspecified joint: Secondary | ICD-10-CM | POA: Insufficient documentation

## 2013-07-19 DIAGNOSIS — M6281 Muscle weakness (generalized): Secondary | ICD-10-CM | POA: Insufficient documentation

## 2013-07-19 DIAGNOSIS — R5381 Other malaise: Secondary | ICD-10-CM | POA: Insufficient documentation

## 2013-07-19 DIAGNOSIS — IMO0001 Reserved for inherently not codable concepts without codable children: Secondary | ICD-10-CM | POA: Insufficient documentation

## 2013-07-20 ENCOUNTER — Encounter: Payer: Self-pay | Admitting: Internal Medicine

## 2013-07-25 ENCOUNTER — Ambulatory Visit: Payer: Medicare HMO | Admitting: *Deleted

## 2013-07-28 ENCOUNTER — Encounter: Payer: Medicare HMO | Admitting: Occupational Therapy

## 2013-08-02 ENCOUNTER — Ambulatory Visit: Payer: Medicare HMO

## 2013-08-04 ENCOUNTER — Encounter: Payer: Medicare HMO | Admitting: Occupational Therapy

## 2013-08-08 ENCOUNTER — Encounter: Payer: Medicare HMO | Admitting: Occupational Therapy

## 2013-08-11 ENCOUNTER — Ambulatory Visit: Payer: Medicare HMO | Admitting: Occupational Therapy

## 2013-08-15 ENCOUNTER — Encounter: Payer: Medicare HMO | Admitting: Occupational Therapy

## 2013-08-17 ENCOUNTER — Ambulatory Visit: Payer: Medicare HMO | Attending: Internal Medicine | Admitting: Occupational Therapy

## 2013-08-17 DIAGNOSIS — M6281 Muscle weakness (generalized): Secondary | ICD-10-CM | POA: Insufficient documentation

## 2013-08-17 DIAGNOSIS — M2550 Pain in unspecified joint: Secondary | ICD-10-CM | POA: Insufficient documentation

## 2013-08-17 DIAGNOSIS — IMO0001 Reserved for inherently not codable concepts without codable children: Secondary | ICD-10-CM | POA: Insufficient documentation

## 2013-08-17 DIAGNOSIS — R5381 Other malaise: Secondary | ICD-10-CM | POA: Insufficient documentation

## 2013-08-22 ENCOUNTER — Encounter: Payer: Self-pay | Admitting: Internal Medicine

## 2013-08-22 ENCOUNTER — Other Ambulatory Visit (INDEPENDENT_AMBULATORY_CARE_PROVIDER_SITE_OTHER): Payer: Medicare HMO

## 2013-08-22 ENCOUNTER — Ambulatory Visit (INDEPENDENT_AMBULATORY_CARE_PROVIDER_SITE_OTHER): Payer: Medicare HMO | Admitting: Internal Medicine

## 2013-08-22 VITALS — BP 140/86 | HR 51 | Temp 97.8°F | Resp 16 | Wt 188.2 lb

## 2013-08-22 DIAGNOSIS — N183 Chronic kidney disease, stage 3 unspecified: Secondary | ICD-10-CM

## 2013-08-22 DIAGNOSIS — E1165 Type 2 diabetes mellitus with hyperglycemia: Secondary | ICD-10-CM

## 2013-08-22 DIAGNOSIS — E7800 Pure hypercholesterolemia, unspecified: Secondary | ICD-10-CM

## 2013-08-22 DIAGNOSIS — G5620 Lesion of ulnar nerve, unspecified upper limb: Secondary | ICD-10-CM

## 2013-08-22 DIAGNOSIS — I10 Essential (primary) hypertension: Secondary | ICD-10-CM

## 2013-08-22 DIAGNOSIS — E1129 Type 2 diabetes mellitus with other diabetic kidney complication: Secondary | ICD-10-CM

## 2013-08-22 DIAGNOSIS — G5622 Lesion of ulnar nerve, left upper limb: Secondary | ICD-10-CM

## 2013-08-22 LAB — URINALYSIS, ROUTINE W REFLEX MICROSCOPIC
Bilirubin Urine: NEGATIVE
HGB URINE DIPSTICK: NEGATIVE
Ketones, ur: NEGATIVE
Leukocytes, UA: NEGATIVE
Nitrite: NEGATIVE
PH: 6 (ref 5.0–8.0)
Specific Gravity, Urine: 1.025 (ref 1.000–1.030)
TOTAL PROTEIN, URINE-UPE24: NEGATIVE
URINE GLUCOSE: NEGATIVE
Urobilinogen, UA: 0.2 (ref 0.0–1.0)

## 2013-08-22 LAB — BASIC METABOLIC PANEL
BUN: 16 mg/dL (ref 6–23)
CHLORIDE: 104 meq/L (ref 96–112)
CO2: 27 mEq/L (ref 19–32)
Calcium: 10 mg/dL (ref 8.4–10.5)
Creatinine, Ser: 1.3 mg/dL (ref 0.4–1.5)
GFR: 71.71 mL/min (ref >60.00)
Glucose, Bld: 126 mg/dL — ABNORMAL HIGH (ref 70–99)
POTASSIUM: 4.7 meq/L (ref 3.5–5.1)
Sodium: 137 mEq/L (ref 135–145)

## 2013-08-22 LAB — LIPID PANEL
Cholesterol: 160 mg/dL (ref 0–200)
HDL: 39.4 mg/dL (ref >39.00)
LDL CALC: 101 mg/dL — AB (ref 0–99)
TRIGLYCERIDES: 99 mg/dL (ref 0.0–149.0)
Total CHOL/HDL Ratio: 4
VLDL: 19.8 mg/dL (ref 0.0–40.0)

## 2013-08-22 LAB — TSH: TSH: 2.79 u[IU]/mL (ref 0.35–5.50)

## 2013-08-22 LAB — HEMOGLOBIN A1C: Hgb A1c MFr Bld: 5.3 % (ref 4.6–6.5)

## 2013-08-22 MED ORDER — POLYETHYL GLYCOL-PROPYL GLYCOL 0.4-0.3 % OP SOLN: 1.0000 [drp] | Freq: Two times a day (BID) | OPHTHALMIC | Status: AC

## 2013-08-22 NOTE — Assessment & Plan Note (Signed)
His BP is adequately well controlled Today I will monitor his lytes and renal function

## 2013-08-22 NOTE — Progress Notes (Signed)
   Subjective:    Patient ID: Tony [name], male    DOB: 05/18/1940, 73 y.o.   MRN: [id number]  HPI Comments: Despite weeks of OT he continues to have numbness and tingling in his left 4th and 5th fingers, he asks for a referral to ortho.  Diabetes He presents for his follow-up diabetic visit. He has type 2 diabetes mellitus. His disease course has been improving. There are no hypoglycemic associated symptoms. Pertinent negatives for hypoglycemia include no dizziness, headaches, seizures, speech difficulty or tremors. Pertinent negatives for diabetes include no blurred vision, no chest pain, no fatigue, no foot paresthesias, no foot ulcerations, no polydipsia, no polyphagia, no polyuria, no visual change, no weakness and no weight loss. There are no hypoglycemic complications. There are no diabetic complications. Current diabetic treatment includes diet. He is compliant with treatment all of the time. There is no change in his home blood glucose trend.      Review of Systems  Constitutional: Negative for fever, chills, weight loss, diaphoresis, appetite change and fatigue.  HENT: Negative.   Eyes: Negative.  Negative for blurred vision.  Respiratory: Negative.  Negative for cough, choking, chest tightness, shortness of breath, wheezing and stridor.   Cardiovascular: Negative.  Negative for chest pain, palpitations and leg swelling.  Gastrointestinal: Negative.  Negative for nausea, vomiting, abdominal pain, diarrhea, constipation and blood in stool.  Endocrine: Negative.  Negative for polydipsia, polyphagia and polyuria.  Genitourinary: Negative.   Musculoskeletal: Negative.  Negative for arthralgias, back pain, joint swelling, myalgias, neck pain and neck stiffness.  Skin: Negative.   Allergic/Immunologic: Negative.   Neurological: Positive for numbness. Negative for dizziness, tremors, seizures, syncope, facial asymmetry, speech difficulty, weakness, light-headedness and headaches.    Hematological: Negative.  Negative for adenopathy. Does not bruise/bleed easily.  Psychiatric/Behavioral: Negative.        Objective:   Physical Exam  Vitals reviewed. Constitutional: He is oriented to person, place, and time. He appears well-developed and well-nourished. No distress.  HENT:  Head: Normocephalic and atraumatic.  Mouth/Throat: Oropharynx is clear and moist. No oropharyngeal exudate.  Eyes: Conjunctivae are normal. Right eye exhibits no discharge. Left eye exhibits no discharge. No scleral icterus.  Neck: Normal range of motion. Neck supple. No JVD present. No tracheal deviation present. No thyromegaly present.  Cardiovascular: Normal rate, regular rhythm, normal heart sounds and intact distal pulses.  Exam reveals no gallop and no friction rub.   No murmur heard. Pulmonary/Chest: Effort normal and breath sounds normal. No stridor. No respiratory distress. He has no wheezes. He has no rales. He exhibits no tenderness.  Abdominal: Soft. Bowel sounds are normal. He exhibits no distension and no mass. There is no tenderness. There is no rebound and no guarding.  Musculoskeletal: Normal range of motion. He exhibits no edema and no tenderness.  Lymphadenopathy:    He has no cervical adenopathy.  Neurological: He is alert and oriented to person, place, and time. He has normal reflexes. He displays normal reflexes. No cranial nerve deficit. He exhibits normal muscle tone. Coordination normal.  Skin: Skin is warm and dry. No rash noted. He is not diaphoretic. No erythema. No pallor.          Assessment & Plan:

## 2013-08-22 NOTE — Assessment & Plan Note (Signed)
Ortho referral  

## 2013-08-22 NOTE — Progress Notes (Signed)
Pre visit review using our clinic review tool, if applicable. No additional management support is needed unless otherwise documented below in the visit note. 

## 2013-08-22 NOTE — Assessment & Plan Note (Signed)
His blood sugars are well controlled 

## 2013-08-22 NOTE — Assessment & Plan Note (Signed)
He has achieved his LDL goal 

## 2013-08-22 NOTE — Patient Instructions (Signed)
Type 2 Diabetes Mellitus, Adult Type 2 diabetes mellitus, often simply referred to as type 2 diabetes, is a long-lasting (chronic) disease. In type 2 diabetes, the pancreas does not make enough insulin (a hormone), the cells are less responsive to the insulin that is made (insulin resistance), or both. Normally, insulin moves sugars from food into the tissue cells. The tissue cells use the sugars for energy. The lack of insulin or the lack of normal response to insulin causes excess sugars to build up in the blood instead of going into the tissue cells. As a result, high blood sugar (hyperglycemia) develops. The effect of high sugar (glucose) levels can cause many complications. Type 2 diabetes was also previously called adult-onset diabetes but it can occur at any age.  RISK FACTORS  A person is predisposed to developing type 2 diabetes if someone in the family has the disease and also has one or more of the following primary risk factors:  Overweight.  An inactive lifestyle.  A history of consistently eating high-calorie foods. Maintaining a normal weight and regular physical activity can reduce the chance of developing type 2 diabetes. SYMPTOMS  A person with type 2 diabetes may not show symptoms initially. The symptoms of type 2 diabetes appear slowly. The symptoms include:  Increased thirst (polydipsia).  Increased urination (polyuria).  Increased urination during the night (nocturia).  Weight loss. This weight loss may be rapid.  Frequent, recurring infections.  Tiredness (fatigue).  Weakness.  Vision changes, such as blurred vision.  Fruity smell to your breath.  Abdominal pain.  Nausea or vomiting.  Cuts or bruises which are slow to heal.  Tingling or numbness in the hands or feet. DIAGNOSIS Type 2 diabetes is frequently not diagnosed until complications of diabetes are present. Type 2 diabetes is diagnosed when symptoms or complications are present and when blood  glucose levels are increased. Your blood glucose level may be checked by one or more of the following blood tests:  A fasting blood glucose test. You will not be allowed to eat for at least 8 hours before a blood sample is taken.  A random blood glucose test. Your blood glucose is checked at any time of the day regardless of when you ate.  A hemoglobin A1c blood glucose test. A hemoglobin A1c test provides information about blood glucose control over the previous 3 months.  An oral glucose tolerance test (OGTT). Your blood glucose is measured after you have not eaten (fasted) for 2 hours and then after you drink a glucose-containing beverage. TREATMENT   You may need to take insulin or diabetes medicine daily to keep blood glucose levels in the desired range.  You will need to match insulin dosing with exercise and healthy food choices. The treatment goal is to maintain the before meal blood sugar (preprandial glucose) level at 70 130 mg/dL. HOME CARE INSTRUCTIONS   Have your hemoglobin A1c level checked twice a year.  Perform daily blood glucose monitoring as directed by your caregiver.  Monitor urine ketones when you are ill and as directed by your caregiver.  Take your diabetes medicine or insulin as directed by your caregiver to maintain your blood glucose levels in the desired range.  Never run out of diabetes medicine or insulin. It is needed every day.  Adjust insulin based on your intake of carbohydrates. Carbohydrates can raise blood glucose levels but need to be included in your diet. Carbohydrates provide vitamins, minerals, and fiber which are an essential part of   a healthy diet. Carbohydrates are found in fruits, vegetables, whole grains, dairy products, legumes, and foods containing added sugars.    Eat healthy foods. Alternate 3 meals with 3 snacks.  Lose weight if overweight.  Carry a medical alert card or wear your medical alert jewelry.  Carry a 15 gram  carbohydrate snack with you at all times to treat low blood glucose (hypoglycemia). Some examples of 15 gram carbohydrate snacks include:  Glucose tablets, 3 or 4   Glucose gel, 15 gram tube  Raisins, 2 tablespoons (24 grams)  Jelly beans, 6  Animal crackers, 8  Regular pop, 4 ounces (120 mL)  Gummy treats, 9  Recognize hypoglycemia. Hypoglycemia occurs with blood glucose levels of 70 mg/dL and below. The risk for hypoglycemia increases when fasting or skipping meals, during or after intense exercise, and during sleep. Hypoglycemia symptoms can include:  Tremors or shakes.  Decreased ability to concentrate.  Sweating.  Increased heart rate.  Headache.  Dry mouth.  Hunger.  Irritability.  Anxiety.  Restless sleep.  Altered speech or coordination.  Confusion.  Treat hypoglycemia promptly. If you are alert and able to safely swallow, follow the 15:15 rule:  Take 15 20 grams of rapid-acting glucose or carbohydrate. Rapid-acting options include glucose gel, glucose tablets, or 4 ounces (120 mL) of fruit juice, regular soda, or low fat milk.  Check your blood glucose level 15 minutes after taking the glucose.  Take 15 20 grams more of glucose if the repeat blood glucose level is still 70 mg/dL or below.  Eat a meal or snack within 1 hour once blood glucose levels return to normal.    Be alert to polyuria and polydipsia which are early signs of hyperglycemia. An early awareness of hyperglycemia allows for prompt treatment. Treat hyperglycemia as directed by your caregiver.  Engage in at least 150 minutes of moderate-intensity physical activity a week, spread over at least 3 days of the week or as directed by your caregiver. In addition, you should engage in resistance exercise at least 2 times a week or as directed by your caregiver.  Adjust your medicine and food intake as needed if you start a new exercise or sport.  Follow your sick day plan at any time you  are unable to eat or drink as usual.  Avoid tobacco use.  Limit alcohol intake to no more than 1 drink per day for nonpregnant women and 2 drinks per day for men. You should drink alcohol only when you are also eating food. Talk with your caregiver whether alcohol is safe for you. Tell your caregiver if you drink alcohol several times a week.  Follow up with your caregiver regularly.  Schedule an eye exam soon after the diagnosis of type 2 diabetes and then annually.  Perform daily skin and foot care. Examine your skin and feet daily for cuts, bruises, redness, nail problems, bleeding, blisters, or sores. A foot exam by a caregiver should be done annually.  Brush your teeth and gums at least twice a day and floss at least once a day. Follow up with your dentist regularly.  Share your diabetes management plan with your workplace or school.  Stay up-to-date with immunizations.  Learn to manage stress.  Obtain ongoing diabetes education and support as needed.  Participate in, or seek rehabilitation as needed to maintain or improve independence and quality of life. Request a physical or occupational therapy referral if you are having foot or hand numbness or difficulties with grooming,   dressing, eating, or physical activity. SEEK MEDICAL CARE IF:   You are unable to eat food or drink fluids for more than 6 hours.  You have nausea and vomiting for more than 6 hours.  Your blood glucose level is over 240 mg/dL.  There is a change in mental status.  You develop an additional serious illness.  You have diarrhea for more than 6 hours.  You have been sick or have had a fever for a couple of days and are not getting better.  You have pain during any physical activity.  SEEK IMMEDIATE MEDICAL CARE IF:  You have difficulty breathing.  You have moderate to large ketone levels. MAKE SURE YOU:  Understand these instructions.  Will watch your condition.  Will get help right away if  you are not doing well or get worse. Document Released: 05/04/2005 Document Revised: 01/27/2012 Document Reviewed: 12/01/2011 [employer] Patient Information 2014 [employer].  

## 2013-08-23 ENCOUNTER — Ambulatory Visit: Payer: Medicare HMO

## 2013-10-13 ENCOUNTER — Encounter: Payer: Self-pay | Admitting: Cardiology

## 2013-10-13 ENCOUNTER — Ambulatory Visit: Payer: Medicare HMO | Admitting: Cardiology

## 2013-10-22 ENCOUNTER — Other Ambulatory Visit: Payer: Self-pay | Admitting: Cardiology

## 2013-10-24 ENCOUNTER — Encounter: Payer: Self-pay | Admitting: Gastroenterology

## 2013-10-30 ENCOUNTER — Ambulatory Visit: Payer: Medicare HMO | Admitting: Cardiology

## 2013-11-03 ENCOUNTER — Other Ambulatory Visit: Payer: Self-pay | Admitting: Internal Medicine

## 2013-11-06 ENCOUNTER — Encounter: Payer: Self-pay | Admitting: Gastroenterology

## 2013-11-08 ENCOUNTER — Ambulatory Visit (INDEPENDENT_AMBULATORY_CARE_PROVIDER_SITE_OTHER): Payer: Medicare HMO | Admitting: Cardiology

## 2013-11-08 ENCOUNTER — Encounter: Payer: Self-pay | Admitting: Cardiology

## 2013-11-08 VITALS — BP 140/90 | HR 60 | Ht 69.0 in | Wt 188.0 lb

## 2013-11-08 DIAGNOSIS — G5622 Lesion of ulnar nerve, left upper limb: Secondary | ICD-10-CM

## 2013-11-08 DIAGNOSIS — E1129 Type 2 diabetes mellitus with other diabetic kidney complication: Secondary | ICD-10-CM

## 2013-11-08 DIAGNOSIS — G5620 Lesion of ulnar nerve, unspecified upper limb: Secondary | ICD-10-CM

## 2013-11-08 DIAGNOSIS — E7800 Pure hypercholesterolemia, unspecified: Secondary | ICD-10-CM

## 2013-11-08 DIAGNOSIS — E1165 Type 2 diabetes mellitus with hyperglycemia: Secondary | ICD-10-CM

## 2013-11-08 DIAGNOSIS — I10 Essential (primary) hypertension: Secondary | ICD-10-CM

## 2013-11-08 DIAGNOSIS — E785 Hyperlipidemia, unspecified: Secondary | ICD-10-CM

## 2013-11-08 MED ORDER — METOPROLOL SUCCINATE ER 100 MG PO TB24: 100.0000 mg | ORAL_TABLET | Freq: Every evening | ORAL | Status: DC

## 2013-11-08 NOTE — Assessment & Plan Note (Signed)
Excellent hgBA1c.  Followed by PCP

## 2013-11-08 NOTE — Assessment & Plan Note (Signed)
Lipid Panel     Component Value Date/Time   CHOL 160 08/22/2013 1111   TRIG 99.0 08/22/2013 1111   HDL 39.40 08/22/2013 1111   CHOLHDL 4 08/22/2013 1111   VLDL 19.8 08/22/2013 1111   LDLCALC 101* 08/22/2013 1111   Continue zocor.
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
## 2013-11-08 NOTE — Assessment & Plan Note (Signed)
Stable BP, if begins climbing he will call, they do check it at home, usually 130/80

## 2013-11-08 NOTE — Assessment & Plan Note (Signed)
Has had for 6 months, Dr. Yetta BarreJones referring to ortho.

## 2013-11-08 NOTE — Patient Instructions (Signed)
Your cholesterol was stable, LDL (bad) cholesterol 101.  Continue with zocor and monitoring diet.  Call Dr. Yetta BarreJones to schedule ortho appt.  Call if any chest pain or SOB.  Follow up with Dr. Antoine PocheHochrein in 1 year.

## 2013-11-21 ENCOUNTER — Other Ambulatory Visit: Payer: Self-pay | Admitting: Cardiology

## 2013-12-22 ENCOUNTER — Encounter: Payer: Self-pay | Admitting: Internal Medicine

## 2013-12-22 ENCOUNTER — Ambulatory Visit (INDEPENDENT_AMBULATORY_CARE_PROVIDER_SITE_OTHER): Payer: Medicare HMO | Admitting: Internal Medicine

## 2013-12-22 VITALS — BP 138/82 | HR 60 | Temp 97.8°F | Resp 16 | Ht 69.0 in | Wt 190.0 lb

## 2013-12-22 DIAGNOSIS — I10 Essential (primary) hypertension: Secondary | ICD-10-CM

## 2013-12-22 DIAGNOSIS — G5620 Lesion of ulnar nerve, unspecified upper limb: Secondary | ICD-10-CM

## 2013-12-22 DIAGNOSIS — M1610 Unilateral primary osteoarthritis, unspecified hip: Secondary | ICD-10-CM

## 2013-12-22 DIAGNOSIS — G5622 Lesion of ulnar nerve, left upper limb: Secondary | ICD-10-CM

## 2013-12-22 MED ORDER — HYDROCODONE-ACETAMINOPHEN 5-325 MG PO TABS: 1.0000 | ORAL_TABLET | Freq: Four times a day (QID) | ORAL | Status: DC | PRN

## 2013-12-22 NOTE — Progress Notes (Signed)
Subjective:    Patient ID: Tony [name], male    DOB: 12/15/1940, 73 y.o.   MRN: [id number]  HPI Comments: He continue to have pain and numbness in his left forearm and left 4th and 5th fingers, he has worked without much relief.     Review of Systems  Constitutional: Negative.  Negative for fever, chills, diaphoresis, appetite change and fatigue.  HENT: Negative.   Eyes: Negative.   Respiratory: Negative.  Negative for cough, choking, chest tightness, shortness of breath and stridor.   Cardiovascular: Negative.  Negative for chest pain, palpitations and leg swelling.  Gastrointestinal: Negative.  Negative for nausea, vomiting, abdominal pain, diarrhea, constipation and blood in stool.  Endocrine: Negative.   Genitourinary: Negative.   Musculoskeletal: Positive for arthralgias. Negative for back pain, gait problem, joint swelling, myalgias, neck pain and neck stiffness.  Skin: Negative.  Negative for rash.  Allergic/Immunologic: Negative.   Neurological: Positive for numbness. Negative for dizziness.  Hematological: Negative.  Negative for adenopathy. Does not bruise/bleed easily.  Psychiatric/Behavioral: Negative.        Objective:   Physical Exam  Vitals reviewed. Constitutional: He is oriented to person, place, and time. He appears well-developed and well-nourished. No distress.  HENT:  Head: Normocephalic and atraumatic.  Mouth/Throat: Oropharynx is clear and moist. No oropharyngeal exudate.  Eyes: Conjunctivae are normal. Right eye exhibits no discharge. Left eye exhibits no discharge. No scleral icterus.  Neck: Normal range of motion. Neck supple. No JVD present. No tracheal deviation present. No thyromegaly present.  Cardiovascular: Normal rate, regular rhythm, normal heart sounds and intact distal pulses.  Exam reveals no gallop and no friction rub.   No murmur heard. Pulmonary/Chest: Effort normal and breath sounds normal. No stridor. No respiratory distress. He has  no wheezes. He has no rales. He exhibits no tenderness.  Abdominal: Soft. Bowel sounds are normal. He exhibits no distension and no mass. There is no tenderness. There is no rebound and no guarding.  Musculoskeletal: Normal range of motion. He exhibits no edema and no tenderness.       Left wrist: Normal. He exhibits normal range of motion, no tenderness, no bony tenderness, no swelling, no effusion, no crepitus, no deformity and no laceration.       Left hand: Normal. He exhibits normal range of motion, no tenderness, no bony tenderness, normal two-point discrimination, normal capillary refill, no laceration and no swelling. Normal sensation noted. Decreased sensation is not present in the ulnar distribution, is not present in the medial redistribution and is not present in the radial distribution.  Lymphadenopathy:    He has no cervical adenopathy.  Neurological: He is oriented to person, place, and time.  Skin: Skin is warm and dry. No rash noted. He is not diaphoretic. No erythema. No pallor.     Lab Results  Component Value Date   WBC 7.2 06/24/2012   HGB 14.4 06/24/2012   HCT 43.3 06/24/2012   PLT 182.0 06/24/2012   GLUCOSE 126* 08/22/2013   CHOL 160 08/22/2013   TRIG 99.0 08/22/2013   HDL 39.40 08/22/2013   LDLCALC 101* 08/22/2013   ALT 35 06/24/2012   AST 28 06/24/2012   NA 137 08/22/2013   K 4.7 08/22/2013   CL 104 08/22/2013   CREATININE 1.3 08/22/2013   BUN 16 08/22/2013   CO2 27 08/22/2013   TSH 2.79 08/22/2013   PSA 0.85 06/24/2012   INR 1.2 09/20/2008   HGBA1C 5.3 08/22/2013  Assessment & Plan:

## 2013-12-22 NOTE — Patient Instructions (Signed)

## 2013-12-22 NOTE — Progress Notes (Signed)
Pre visit review using our clinic review tool, if applicable. No additional management support is needed unless otherwise documented below in the visit note. 

## 2013-12-24 ENCOUNTER — Encounter: Payer: Self-pay | Admitting: Internal Medicine

## 2013-12-24 NOTE — Assessment & Plan Note (Signed)
His BP is well controlled 

## 2013-12-24 NOTE — Assessment & Plan Note (Signed)
Will cont norco for pain management Ortho referral for further evaluation

## 2013-12-28 ENCOUNTER — Ambulatory Visit (AMBULATORY_SURGERY_CENTER): Payer: Medicare HMO | Admitting: *Deleted

## 2013-12-28 VITALS — Ht 69.0 in | Wt 191.0 lb

## 2013-12-28 DIAGNOSIS — Z86010 Personal history of colonic polyps: Secondary | ICD-10-CM

## 2013-12-28 MED ORDER — MOVIPREP 100 G PO SOLR: 1.0000 | Freq: Once | ORAL | Status: DC

## 2013-12-28 NOTE — Progress Notes (Signed)
Denies allergies to eggs or soy products. Denies complications with sedation or anesthesia. Denies O2 use. Denies use of diet or weight loss medications.  Emmi instructions given for colonoscopy.  

## 2014-01-02 ENCOUNTER — Encounter: Payer: Self-pay | Admitting: Gastroenterology

## 2014-01-11 ENCOUNTER — Encounter: Payer: Self-pay | Admitting: Gastroenterology

## 2014-01-11 ENCOUNTER — Ambulatory Visit (AMBULATORY_SURGERY_CENTER): Payer: Medicare HMO | Admitting: Gastroenterology

## 2014-01-11 VITALS — BP 113/70 | HR 71 | Temp 98.1°F | Resp 13 | Ht 69.0 in | Wt 191.0 lb

## 2014-01-11 DIAGNOSIS — Z86010 Personal history of colonic polyps: Secondary | ICD-10-CM

## 2014-01-11 MED ORDER — SODIUM CHLORIDE 0.9 % IV SOLN: 500.0000 mL | INTRAVENOUS | Status: DC

## 2014-01-11 NOTE — Op Note (Signed)
[facility] [address] [place], [address]   COLONOSCOPY PROCEDURE REPORT  PATIENT: Tony [name], Tony [name]  MR#: [id number] BIRTHDATE: 09-06-1940 , 73  yrs. old GENDER: Male ENDOSCOPIST: Meryl Dare, MD, [facility] PROCEDURE DATE:  01/11/2014 PROCEDURE:   Colonoscopy, surveillance First Screening Colonoscopy - Avg.  risk and is 50 yrs.  old or older - No.  Prior Negative Screening - Now for repeat screening. N/A  History of Adenoma - Now for follow-up colonoscopy & has been > or = to 3 yrs.  Yes hx of adenoma.  Has been 3 or more years since last colonoscopy.  Polyps Removed Today? No.  Recommend repeat exam, <10 yrs? Yes.  High risk (family or personal hx). ASA CLASS:   Class II INDICATIONS:Patient's personal history of adenomatous colon polyps.  MEDICATIONS: MAC sedation, administered by CRNA and propofol (Diprivan)  IV DESCRIPTION OF PROCEDURE:   After the risks benefits and alternatives of the procedure were thoroughly explained, informed consent was obtained.  A digital rectal exam revealed no abnormalities of the rectum.   The LB [id number]  endoscope was introduced through the anus and advanced to the cecum, which was identified by both the appendix and ileocecal valve. No adverse events experienced.   The quality of the prep was excellent, using MoviPrep  The instrument was then slowly withdrawn as the colon was fully examined.  COLON FINDINGS: Very mild diverticulosis was noted in the sigmoid colon.   The colon was otherwise normal.  There was no diverticulosis, inflammation, polyps or cancers unless previously stated.  Retroflexed views revealed no abnormalities. The time to cecum=2 minutes 01 seconds.  Withdrawal time=10 minutes 05 seconds. The scope was withdrawn and the procedure completed.  COMPLICATIONS: There were no complications.  ENDOSCOPIC IMPRESSION: 1.   Very mild diverticulosis in the sigmoid colon 2.   The colon was otherwise  normal  RECOMMENDATIONS: 1.  High fiber diet with liberal fluid intake. 2.  Repeat Colonoscopy in 5 years.  eSigned:  Meryl Dare, MD, [facility] 01/11/2014 8:48 AM

## 2014-01-11 NOTE — Patient Instructions (Signed)
Discharge instructions given with verbal understanding. Resume previous medications. YOU HAD AN ENDOSCOPIC PROCEDURE TODAY AT [facility]: Refer to the procedure report that was given to you for any specific questions about what was found during the examination.  If the procedure report does not answer your questions, please call your gastroenterologist to clarify.  If you requested that your care partner not be given the details of your procedure findings, then the procedure report has been included in a sealed envelope for you to review at your convenience later.  YOU SHOULD EXPECT: Some feelings of bloating in the abdomen. Passage of more gas than usual.  Walking can help get rid of the air that was put into your GI tract during the procedure and reduce the bloating. If you had a lower endoscopy (such as a colonoscopy or flexible sigmoidoscopy) you may notice spotting of blood in your stool or on the toilet paper. If you underwent a bowel prep for your procedure, then you may not have a normal bowel movement for a few days.  DIET: Your first meal following the procedure should be a light meal and then it is ok to progress to your normal diet.  A half-sandwich or bowl of soup is an example of a good first meal.  Heavy or fried foods are harder to digest and may make you feel nauseous or bloated.  Likewise meals heavy in dairy and vegetables can cause extra gas to form and this can also increase the bloating.  Drink plenty of fluids but you should avoid alcoholic beverages for 24 hours.  ACTIVITY: Your care partner should take you home directly after the procedure.  You should plan to take it easy, moving slowly for the rest of the day.  You can resume normal activity the day after the procedure however you should NOT DRIVE or use heavy machinery for 24 hours (because of the sedation medicines used during the test).    SYMPTOMS TO REPORT IMMEDIATELY: A gastroenterologist can be reached  at any hour.  During normal business hours, 8:30 AM to 5:00 PM Monday through Friday, call [phone].  After hours and on weekends, please call the GI answering service at [phone] who will take a message and have the physician on call contact you.   Following lower endoscopy (colonoscopy or flexible sigmoidoscopy):  Excessive amounts of blood in the stool  Significant tenderness or worsening of abdominal pains  Swelling of the abdomen that is new, acute  Fever of 100F or higher  FOLLOW UP: If any biopsies were taken you will be contacted by phone or by letter within the next 1-3 weeks.  Call your gastroenterologist if you have not heard about the biopsies in 3 weeks.  Our staff will call the home number listed on your records the next business day following your procedure to check on you and address any questions or concerns that you may have at that time regarding the information given to you following your procedure. This is a courtesy call and so if there is no answer at the home number and we have not heard from you through the emergency physician on call, we will assume that you have returned to your regular daily activities without incident.  SIGNATURES/CONFIDENTIALITY: You and/or your care partner have signed paperwork which will be entered into your electronic medical record.  These signatures attest to the fact that that the information above on your After Visit Summary has been reviewed and is  understood.  Full responsibility of the confidentiality of this discharge information lies with you and/or your care-partner.   Diverticulosis Diverticulosis is the condition that develops when small pouches (diverticula) form in the wall of your colon. Your colon, or large intestine, is where water is absorbed and stool is formed. The pouches form when the inside layer of your colon pushes through weak spots in the outer layers of your colon. CAUSES  No one knows exactly what causes  diverticulosis. RISK FACTORS  Being older than 50. Your risk for this condition increases with age. Diverticulosis is rare in people younger than 40 years. By age 29, almost everyone has it.  Eating a low-fiber diet.  Being frequently constipated.  Being overweight.  Not getting enough exercise.  Smoking.  Taking over-the-counter pain medicines, like aspirin and ibuprofen. SYMPTOMS  Most people with diverticulosis do not have symptoms. DIAGNOSIS  Because diverticulosis often has no symptoms, health care providers often discover the condition during an exam for other colon problems. In many cases, a health care provider will diagnose diverticulosis while using a flexible scope to examine the colon (colonoscopy). TREATMENT  If you have never developed an infection related to diverticulosis, you may not need treatment. If you have had an infection before, treatment may include:  Eating more fruits, vegetables, and grains.  Taking a fiber supplement.  Taking a live bacteria supplement (probiotic).  Taking medicine to relax your colon. HOME CARE INSTRUCTIONS   Drink at least 6-8 glasses of water each day to prevent constipation.  Try not to strain when you have a bowel movement.  Keep all follow-up appointments. If you have had an infection before:  Increase the fiber in your diet as directed by your health care provider or dietitian.  Take a dietary fiber supplement if your health care provider approves.  Only take medicines as directed by your health care provider. SEEK MEDICAL CARE IF:   You have abdominal pain.  You have bloating.  You have cramps.  You have not gone to the bathroom in 3 days. SEEK IMMEDIATE MEDICAL CARE IF:   Your pain gets worse.  Yourbloating becomes very bad.  You have a fever or chills, and your symptoms suddenly get worse.  You begin vomiting.  You have bowel movements that are bloody or black. MAKE SURE YOU:  Understand these  instructions.  Will watch your condition.  Will get help right away if you are not doing well or get worse. Document Released: 01/30/2004 Document Revised: 05/09/2013 Document Reviewed: 03/29/2013 [facility] Patient Information 2015 [place], [place]. This information is not intended to replace advice given to you by your health care provider. Make sure you discuss any questions you have with your health care provider.

## 2014-01-11 NOTE — Progress Notes (Signed)
A/ox3 pleased with MAC, report to Celia RN 

## 2014-01-12 ENCOUNTER — Telehealth: Payer: Self-pay | Admitting: *Deleted

## 2014-01-12 LAB — HM COLONOSCOPY: HM Colonoscopy: NORMAL

## 2014-01-12 NOTE — Telephone Encounter (Signed)
  Follow up Call-  Call back number 01/11/2014  Post procedure Call Back phone  # [phone]  Permission to leave phone message Yes     Patient questions:  Do you have a fever, pain , or abdominal swelling? No. Pain Score  0 *  Have you tolerated food without any problems? Yes.    Have you been able to return to your normal activities? Yes.    Do you have any questions about your discharge instructions: Diet   No. Medications  No. Follow up visit  No.  Do you have questions or concerns about your Care? No.  Actions: * If pain score is 4 or above: No action needed, pain <4.

## 2014-02-13 ENCOUNTER — Ambulatory Visit (INDEPENDENT_AMBULATORY_CARE_PROVIDER_SITE_OTHER): Payer: Medicare HMO | Admitting: Neurology

## 2014-02-13 ENCOUNTER — Encounter: Payer: Medicare HMO | Admitting: Radiology

## 2014-02-13 ENCOUNTER — Ambulatory Visit (INDEPENDENT_AMBULATORY_CARE_PROVIDER_SITE_OTHER): Payer: Self-pay

## 2014-02-13 DIAGNOSIS — E1165 Type 2 diabetes mellitus with hyperglycemia: Secondary | ICD-10-CM

## 2014-02-13 DIAGNOSIS — Z0289 Encounter for other administrative examinations: Secondary | ICD-10-CM

## 2014-02-13 DIAGNOSIS — E1129 Type 2 diabetes mellitus with other diabetic kidney complication: Secondary | ICD-10-CM

## 2014-02-13 DIAGNOSIS — E7800 Pure hypercholesterolemia, unspecified: Secondary | ICD-10-CM

## 2014-02-13 DIAGNOSIS — G5603 Carpal tunnel syndrome, bilateral upper limbs: Secondary | ICD-10-CM

## 2014-02-13 DIAGNOSIS — I10 Essential (primary) hypertension: Secondary | ICD-10-CM

## 2014-02-13 DIAGNOSIS — IMO0002 Reserved for concepts with insufficient information to code with codable children: Secondary | ICD-10-CM

## 2014-02-13 DIAGNOSIS — G5622 Lesion of ulnar nerve, left upper limb: Secondary | ICD-10-CM

## 2014-02-13 DIAGNOSIS — G5600 Carpal tunnel syndrome, unspecified upper limb: Secondary | ICD-10-CM

## 2014-02-13 DIAGNOSIS — G5620 Lesion of ulnar nerve, unspecified upper limb: Secondary | ICD-10-CM
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
## 2014-02-13 NOTE — Procedures (Signed)
   NCS (NERVE CONDUCTION STUDY) WITH EMG (ELECTROMYOGRAPHY) REPORT   STUDY DATE: February 13 2014 PATIENT NAME: Tony OppenheimRichard [name] DOB: 02/12/1941 MRN: [id number]    TECHNOLOGIST: Gearldine ShownLorraine Jones ELECTROMYOGRAPHER: Levert FeinsteinYan, Erastus Bartolomei M.D.  CLINICAL INFORMATION:  73 years old [place] male, presenting with six-month history of left fourth and fifth finger paresthesia.  On examination, he has left intrinsic hand muscle atrophy, mild left finger abduction weakness, left wrist flexion weakness. Decreased sensation at the left fourth and fifth fingers, extending to left ulnar palm, and dorsum hand. Hyperreflexia of both upper and lower extremity.  FINDINGS: NERVE CONDUCTION STUDY: Bilateral median sensory response showed a mildly prolonged peak latency, with preserved snap amplitude.  Right ulnar sensory and motor responses were normal.  Left ulnar sensory response was absent. Left ulnar motor response showed severely decreased C. map amplitude, further drop-down of C. map amplitude across left elbow, slow conduction velocity, absent left ulnar F wave.   Bilateral median motor responses showed mildly prolonged distal latency, with preserved C. map amplitude, conduction velocity.  NEEDLE ELECTROMYOGRAPHY:  Selected needle examination was performed at left upper extremity muscles, left cervical paraspinal muscles  Left first dorsal interossei, abducted digital minimi: Increased insertion activity, 2 plus spontaneous activity, enlarged complex motor unit potential, with decreased recruitment patterns.  Left flexor carpi ulnaris: Increased insertion activity, No spontaneous activity, enlarged complex motor unit potential, with mildly decreased recruitment patterns.  Needle examination of left pronator teres, brachioradialis, extensor digital communis was normal.  Left biceps, triceps, deltoid: Normally insertion activity, mild enlarged motor unit potential, with mildly decreased recruitment  patterns.  There was no spontaneous activity at left cervical paraspinal muscles, left C5, 6 and 7   IMPRESSION:   This is an abnormal study. There is electrodiagnostic evidence of severe axonal left ulnar neuropathy, likely across left elbow, there is also evidence of chronic left cervical radiculopathy, involving left C5, 6 myotomes. In addition, there is evidence of moderate bilateral carpal tunnel syndromes.    INTERPRETING PHYSICIAN:   Levert FeinsteinYan, Saleema Weppler M.D. Ph.D. [facility] [address], [address] [place], [place] [id number] [phone]

## 2014-02-23 ENCOUNTER — Other Ambulatory Visit: Payer: Self-pay | Admitting: Neurology

## 2014-02-23 ENCOUNTER — Ambulatory Visit (INDEPENDENT_AMBULATORY_CARE_PROVIDER_SITE_OTHER): Payer: Medicare HMO | Admitting: Radiology

## 2014-02-23 ENCOUNTER — Encounter (INDEPENDENT_AMBULATORY_CARE_PROVIDER_SITE_OTHER): Payer: Self-pay

## 2014-02-23 DIAGNOSIS — G9389 Other specified disorders of brain: Secondary | ICD-10-CM

## 2014-02-23 DIAGNOSIS — S06899S Other specified intracranial injury with loss of consciousness of unspecified duration, sequela: Secondary | ICD-10-CM

## 2014-02-23 DIAGNOSIS — S0193XS Puncture wound without foreign body of unspecified part of head, sequela: Secondary | ICD-10-CM

## 2014-02-23 DIAGNOSIS — E7800 Pure hypercholesterolemia, unspecified: Secondary | ICD-10-CM

## 2014-02-23 DIAGNOSIS — E1165 Type 2 diabetes mellitus with hyperglycemia: Secondary | ICD-10-CM

## 2014-02-23 DIAGNOSIS — W3400XS Accidental discharge from unspecified firearms or gun, sequela: Secondary | ICD-10-CM

## 2014-02-23 DIAGNOSIS — E1129 Type 2 diabetes mellitus with other diabetic kidney complication: Secondary | ICD-10-CM

## 2014-02-23 DIAGNOSIS — G5622 Lesion of ulnar nerve, left upper limb: Secondary | ICD-10-CM

## 2014-02-23 DIAGNOSIS — Z9889 Other specified postprocedural states: Secondary | ICD-10-CM

## 2014-02-23 DIAGNOSIS — IMO0002 Reserved for concepts with insufficient information to code with codable children: Secondary | ICD-10-CM

## 2014-02-23 DIAGNOSIS — I10 Essential (primary) hypertension: Secondary | ICD-10-CM
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
## 2014-02-26 ENCOUNTER — Ambulatory Visit
Admission: RE | Admit: 2014-02-26 | Discharge: 2014-02-26 | Disposition: A | Discharge status: Home / Self Care | Payer: Medicare HMO | Source: Ambulatory Visit | Attending: Neurology | Admitting: Neurology

## 2014-02-26 DIAGNOSIS — IMO0002 Reserved for concepts with insufficient information to code with codable children: Secondary | ICD-10-CM

## 2014-02-26 DIAGNOSIS — I10 Essential (primary) hypertension: Secondary | ICD-10-CM

## 2014-02-26 DIAGNOSIS — E7800 Pure hypercholesterolemia, unspecified: Secondary | ICD-10-CM

## 2014-02-26 DIAGNOSIS — E1165 Type 2 diabetes mellitus with hyperglycemia: Secondary | ICD-10-CM

## 2014-02-26 DIAGNOSIS — E1129 Type 2 diabetes mellitus with other diabetic kidney complication: Secondary | ICD-10-CM

## 2014-02-26 DIAGNOSIS — G5622 Lesion of ulnar nerve, left upper limb: Secondary | ICD-10-CM

## 2014-02-27 ENCOUNTER — Telehealth: Payer: Self-pay | Admitting: Neurology

## 2014-02-27 NOTE — Telephone Encounter (Signed)
I have called and left message, no acute problem at CT cervical, will go over in detail in follow up visit Oct 27th.  Multilevel degenerative disc and joint disease without focal  neural impingement on the left.  2. Auto fusion of C6-7. Slight bilateral foraminal stenosis at C6-7.

## 2014-02-28 DIAGNOSIS — S06899A Other specified intracranial injury with loss of consciousness of unspecified duration, initial encounter: Secondary | ICD-10-CM | POA: Insufficient documentation

## 2014-02-28 DIAGNOSIS — W3400XA Accidental discharge from unspecified firearms or gun, initial encounter: Secondary | ICD-10-CM

## 2014-02-28 DIAGNOSIS — Z9889 Other specified postprocedural states: Secondary | ICD-10-CM | POA: Insufficient documentation

## 2014-02-28 DIAGNOSIS — S0193XA Puncture wound without foreign body of unspecified part of head, initial encounter: Secondary | ICD-10-CM

## 2014-02-28 DIAGNOSIS — G9389 Other specified disorders of brain: Secondary | ICD-10-CM | POA: Insufficient documentation

## 2014-03-13 ENCOUNTER — Encounter: Payer: Self-pay | Admitting: Neurology

## 2014-03-13 ENCOUNTER — Ambulatory Visit (INDEPENDENT_AMBULATORY_CARE_PROVIDER_SITE_OTHER): Payer: Medicare HMO | Admitting: Neurology

## 2014-03-13 VITALS — BP 143/87 | HR 57 | Ht 69.0 in | Wt 179.0 lb

## 2014-03-13 DIAGNOSIS — S0193XD Puncture wound without foreign body of unspecified part of head, subsequent encounter: Secondary | ICD-10-CM

## 2014-03-13 DIAGNOSIS — S06899D Other specified intracranial injury with loss of consciousness of unspecified duration, subsequent encounter: Secondary | ICD-10-CM

## 2014-03-13 DIAGNOSIS — W3400XD Accidental discharge from unspecified firearms or gun, subsequent encounter: Secondary | ICD-10-CM

## 2014-03-13 DIAGNOSIS — R569 Unspecified convulsions: Secondary | ICD-10-CM | POA: Insufficient documentation

## 2014-03-13 DIAGNOSIS — I10 Essential (primary) hypertension: Secondary | ICD-10-CM

## 2014-03-13 DIAGNOSIS — G5622 Lesion of ulnar nerve, left upper limb: Secondary | ICD-10-CM

## 2014-03-13 MED ORDER — LEVETIRACETAM 500 MG PO TABS: 500.0000 mg | ORAL_TABLET | Freq: Two times a day (BID) | ORAL | Status: DC
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
## 2014-04-24 ENCOUNTER — Ambulatory Visit: Payer: Medicare HMO | Admitting: Internal Medicine

## 2014-05-01 ENCOUNTER — Other Ambulatory Visit (INDEPENDENT_AMBULATORY_CARE_PROVIDER_SITE_OTHER): Payer: Medicare HMO

## 2014-05-01 ENCOUNTER — Ambulatory Visit (INDEPENDENT_AMBULATORY_CARE_PROVIDER_SITE_OTHER): Payer: Medicare HMO | Admitting: Internal Medicine

## 2014-05-01 ENCOUNTER — Encounter: Payer: Self-pay | Admitting: Internal Medicine

## 2014-05-01 VITALS — BP 140/90 | HR 57 | Temp 98.3°F | Resp 16

## 2014-05-01 DIAGNOSIS — G5622 Lesion of ulnar nerve, left upper limb: Secondary | ICD-10-CM

## 2014-05-01 DIAGNOSIS — N183 Chronic kidney disease, stage 3 unspecified: Secondary | ICD-10-CM

## 2014-05-01 DIAGNOSIS — Z23 Encounter for immunization: Secondary | ICD-10-CM

## 2014-05-01 DIAGNOSIS — E7800 Pure hypercholesterolemia, unspecified: Secondary | ICD-10-CM

## 2014-05-01 DIAGNOSIS — M1610 Unilateral primary osteoarthritis, unspecified hip: Secondary | ICD-10-CM

## 2014-05-01 DIAGNOSIS — R739 Hyperglycemia, unspecified: Secondary | ICD-10-CM

## 2014-05-01 DIAGNOSIS — I10 Essential (primary) hypertension: Secondary | ICD-10-CM

## 2014-05-01 LAB — URINALYSIS, ROUTINE W REFLEX MICROSCOPIC
BILIRUBIN URINE: NEGATIVE
HGB URINE DIPSTICK: NEGATIVE
Ketones, ur: NEGATIVE
Leukocytes, UA: NEGATIVE
Nitrite: NEGATIVE
RBC / HPF: NONE SEEN (ref 0–?)
Specific Gravity, Urine: 1.02 (ref 1.000–1.030)
Total Protein, Urine: NEGATIVE
URINE GLUCOSE: NEGATIVE
UROBILINOGEN UA: 0.2 (ref 0.0–1.0)
pH: 5.5 (ref 5.0–8.0)

## 2014-05-01 LAB — BASIC METABOLIC PANEL
BUN: 18 mg/dL (ref 6–23)
CALCIUM: 9.7 mg/dL (ref 8.4–10.5)
CO2: 24 mEq/L (ref 19–32)
CREATININE: 1.2 mg/dL (ref 0.4–1.5)
Chloride: 108 mEq/L (ref 96–112)
GFR: 76.41 mL/min (ref >60.00)
Glucose, Bld: 97 mg/dL (ref 70–99)
Potassium: 3.9 mEq/L (ref 3.5–5.1)
Sodium: 139 mEq/L (ref 135–145)

## 2014-05-01 LAB — LIPID PANEL
Cholesterol: 152 mg/dL (ref 0–200)
HDL: 36.2 mg/dL — ABNORMAL LOW (ref >39.00)
LDL CALC: 100 mg/dL — AB (ref 0–99)
NonHDL: 115.8
TRIGLYCERIDES: 78 mg/dL (ref 0.0–149.0)
Total CHOL/HDL Ratio: 4
VLDL: 15.6 mg/dL (ref 0.0–40.0)

## 2014-05-01 LAB — HEMOGLOBIN A1C: Hgb A1c MFr Bld: 5.5 % (ref 4.6–6.5)

## 2014-05-01 LAB — TSH: TSH: 2.61 u[IU]/mL (ref 0.35–4.50)

## 2014-05-01 MED ORDER — HYDROCODONE-ACETAMINOPHEN 5-325 MG PO TABS: 1.0000 | ORAL_TABLET | Freq: Four times a day (QID) | ORAL | Status: DC | PRN

## 2014-05-01 NOTE — Patient Instructions (Signed)

## 2014-05-01 NOTE — Progress Notes (Signed)
Subjective:    Patient ID: Tony [name], male    DOB: 12/02/1940, 73 y.o.   MRN: [id number]  Hypertension This is a chronic problem. The current episode started more than 1 year ago. The problem is unchanged. The problem is controlled. Pertinent negatives include no anxiety, blurred vision, chest pain, headaches, malaise/fatigue, neck pain, orthopnea, palpitations, peripheral edema, PND, shortness of breath or sweats. Past treatments include calcium channel blockers and beta blockers. The current treatment provides significant improvement. Compliance problems include diet and exercise.  Hypertensive end-organ damage includes kidney disease. Identifiable causes of hypertension include chronic renal disease.      Review of Systems  Constitutional: Negative.  Negative for fever, chills, malaise/fatigue, diaphoresis, appetite change and fatigue.  HENT: Negative.   Eyes: Negative.  Negative for blurred vision.  Respiratory: Negative.  Negative for cough, choking, chest tightness, shortness of breath and stridor.   Cardiovascular: Negative.  Negative for chest pain, palpitations, orthopnea, leg swelling and PND.  Gastrointestinal: Negative.  Negative for nausea, vomiting, abdominal pain, diarrhea, constipation and blood in stool.  Endocrine: Negative.  Negative for polydipsia, polyphagia and polyuria.  Genitourinary: Negative.   Musculoskeletal: Positive for back pain and arthralgias. Negative for neck pain.  Skin: Negative.  Negative for rash.  Allergic/Immunologic: Negative.   Neurological: Negative.  Negative for dizziness, tremors, seizures, syncope, facial asymmetry, light-headedness, numbness and headaches.  Hematological: Negative.  Negative for adenopathy. Does not bruise/bleed easily.  Psychiatric/Behavioral: Negative.        Objective:   Physical Exam  Constitutional: He is oriented to person, place, and time. He appears well-developed and well-nourished. No distress.  HENT:    Head: Normocephalic and atraumatic.  Mouth/Throat: Oropharynx is clear and moist. No oropharyngeal exudate.  Eyes: Conjunctivae are normal. Right eye exhibits no discharge. Left eye exhibits no discharge. No scleral icterus.  Neck: Normal range of motion. Neck supple. No JVD present. No tracheal deviation present. No thyromegaly present.  Cardiovascular: Normal rate, regular rhythm, normal heart sounds and intact distal pulses.  Exam reveals no gallop and no friction rub.   No murmur heard. Pulmonary/Chest: Effort normal and breath sounds normal. No stridor. No respiratory distress. He has no wheezes. He has no rales. He exhibits no tenderness.  Abdominal: Soft. Bowel sounds are normal. He exhibits no distension and no mass. There is no tenderness. There is no rebound and no guarding.  Musculoskeletal: Normal range of motion. He exhibits no edema or tenderness.  Lymphadenopathy:    He has no cervical adenopathy.  Neurological: He is oriented to person, place, and time.  Skin: Skin is warm and dry. No rash noted. He is not diaphoretic. No erythema. No pallor.  Psychiatric: He has a normal mood and affect. His behavior is normal. Judgment and thought content normal.  Vitals reviewed.    Lab Results  Component Value Date   WBC 7.2 06/24/2012   HGB 14.4 06/24/2012   HCT 43.3 06/24/2012   PLT 182.0 06/24/2012   GLUCOSE 126* 08/22/2013   CHOL 160 08/22/2013   TRIG 99.0 08/22/2013   HDL 39.40 08/22/2013   LDLCALC 101* 08/22/2013   ALT 35 06/24/2012   AST 28 06/24/2012   NA 137 08/22/2013   K 4.7 08/22/2013   CL 104 08/22/2013   CREATININE 1.3 08/22/2013   BUN 16 08/22/2013   CO2 27 08/22/2013   TSH 2.79 08/22/2013   PSA 0.85 06/24/2012   INR 1.2 09/20/2008   HGBA1C 5.3 08/22/2013  Assessment & Plan:

## 2014-05-02 NOTE — Assessment & Plan Note (Signed)
His renal function is stable/improved He will cont to avoid nephrotoxic agents and will control his BP He will work on his lifestyle midifications

## 2014-05-02 NOTE — Assessment & Plan Note (Signed)
His BP is well controlled Lytes and renal function are stable 

## 2014-05-02 NOTE — Assessment & Plan Note (Signed)
He will cont norco as needed for pain 

## 2014-05-14 ENCOUNTER — Ambulatory Visit: Payer: Medicare HMO | Admitting: Neurology

## 2014-05-16 ENCOUNTER — Ambulatory Visit (INDEPENDENT_AMBULATORY_CARE_PROVIDER_SITE_OTHER): Payer: Medicare HMO | Admitting: Neurology

## 2014-05-16 ENCOUNTER — Encounter: Payer: Self-pay | Admitting: Neurology

## 2014-05-16 VITALS — BP 144/81 | HR 53 | Ht 69.0 in | Wt 179.0 lb

## 2014-05-16 DIAGNOSIS — G5622 Lesion of ulnar nerve, left upper limb: Secondary | ICD-10-CM

## 2014-05-16 DIAGNOSIS — S0193XD Puncture wound without foreign body of unspecified part of head, subsequent encounter: Secondary | ICD-10-CM

## 2014-05-16 DIAGNOSIS — W3400XD Accidental discharge from unspecified firearms or gun, subsequent encounter: Secondary | ICD-10-CM

## 2014-05-16 DIAGNOSIS — S06899D Other specified intracranial injury with loss of consciousness of unspecified duration, subsequent encounter: Secondary | ICD-10-CM

## 2014-05-16 DIAGNOSIS — R569 Unspecified convulsions: Secondary | ICD-10-CM

## 2014-05-16 DIAGNOSIS — I10 Essential (primary) hypertension: Secondary | ICD-10-CM
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
## 2014-07-09 ENCOUNTER — Other Ambulatory Visit: Payer: Self-pay

## 2014-07-09 MED ORDER — IRBESARTAN 300 MG PO TABS: 300.0000 mg | ORAL_TABLET | Freq: Every day | ORAL | Status: DC

## 2014-08-31 ENCOUNTER — Ambulatory Visit: Payer: Medicare HMO | Admitting: Internal Medicine

## 2014-09-03 ENCOUNTER — Encounter: Payer: Self-pay | Admitting: Internal Medicine

## 2014-09-03 ENCOUNTER — Ambulatory Visit (INDEPENDENT_AMBULATORY_CARE_PROVIDER_SITE_OTHER): Payer: PPO | Admitting: Internal Medicine

## 2014-09-03 VITALS — BP 148/84 | HR 82 | Temp 98.3°F | Resp 16 | Wt 185.0 lb

## 2014-09-03 DIAGNOSIS — I10 Essential (primary) hypertension: Secondary | ICD-10-CM | POA: Diagnosis not present

## 2014-09-03 DIAGNOSIS — H409 Unspecified glaucoma: Secondary | ICD-10-CM | POA: Insufficient documentation

## 2014-09-03 DIAGNOSIS — Z0000 Encounter for general adult medical examination without abnormal findings: Secondary | ICD-10-CM

## 2014-09-03 DIAGNOSIS — E7800 Pure hypercholesterolemia, unspecified: Secondary | ICD-10-CM

## 2014-09-03 DIAGNOSIS — N183 Chronic kidney disease, stage 3 unspecified: Secondary | ICD-10-CM

## 2014-09-03 MED ORDER — BIMATOPROST 0.03 % OP SOLN: 1.0000 [drp] | Freq: Every day | OPHTHALMIC | Status: DC

## 2014-09-03 NOTE — Progress Notes (Signed)
   Subjective:    Patient ID: Tony [name], male    DOB: 03/06/1941, 74 y.o.   MRN: [id number]  Hypertension This is a chronic problem. The current episode started more than 1 year ago. The problem is unchanged. The problem is controlled. Pertinent negatives include no anxiety, blurred vision, chest pain, headaches, malaise/fatigue, neck pain, orthopnea, palpitations, peripheral edema, PND, shortness of breath or sweats. There are no associated agents to hypertension. Past treatments include angiotensin blockers, beta blockers and calcium channel blockers. The current treatment provides moderate improvement. There are no compliance problems.  Hypertensive end-organ damage includes kidney disease. Identifiable causes of hypertension include chronic renal disease.      Review of Systems  Constitutional: Negative.  Negative for fever, chills, malaise/fatigue, diaphoresis, appetite change and fatigue.  HENT: Negative.   Eyes: Negative.  Negative for blurred vision, photophobia, pain, redness and visual disturbance.  Respiratory: Negative.  Negative for cough, choking, chest tightness and shortness of breath.   Cardiovascular: Negative.  Negative for chest pain, palpitations, orthopnea, leg swelling and PND.  Gastrointestinal: Negative.  Negative for abdominal pain.  Endocrine: Negative.   Genitourinary: Negative.   Musculoskeletal: Negative.  Negative for back pain, arthralgias and neck pain.  Skin: Negative.   Allergic/Immunologic: Negative.   Neurological: Negative.  Negative for headaches.  Hematological: Negative.  Negative for adenopathy. Does not bruise/bleed easily.  Psychiatric/Behavioral: Negative.        Objective:   Physical Exam  Constitutional: He is oriented to person, place, and time. He appears well-developed and well-nourished. No distress.  HENT:  Head: Normocephalic and atraumatic.  Mouth/Throat: Oropharynx is clear and moist.  Eyes: Conjunctivae are normal. Right  eye exhibits no discharge. Left eye exhibits no discharge. No scleral icterus.  Neck: Normal range of motion. Neck supple. No JVD present. No tracheal deviation present. No thyromegaly present.  Cardiovascular: Normal rate, regular rhythm, normal heart sounds and intact distal pulses.  Exam reveals no gallop and no friction rub.   No murmur heard. Pulmonary/Chest: Effort normal and breath sounds normal. No stridor. No respiratory distress. He has no wheezes. He has no rales. He exhibits no tenderness.  Abdominal: Soft. Bowel sounds are normal. He exhibits no distension and no mass. There is no tenderness. There is no rebound and no guarding.  Musculoskeletal: Normal range of motion. He exhibits no edema or tenderness.  Lymphadenopathy:    He has no cervical adenopathy.  Neurological: He is oriented to person, place, and time.  Skin: Skin is warm and dry. No rash noted. He is not diaphoretic. No erythema. No pallor.  Psychiatric: He has a normal mood and affect. His behavior is normal. Judgment and thought content normal.  Vitals reviewed.    Lab Results  Component Value Date   WBC 7.2 06/24/2012   HGB 14.4 06/24/2012   HCT 43.3 06/24/2012   PLT 182.0 06/24/2012   GLUCOSE 97 05/01/2014   CHOL 152 05/01/2014   TRIG 78.0 05/01/2014   HDL 36.20* 05/01/2014   LDLCALC 100* 05/01/2014   ALT 35 06/24/2012   AST 28 06/24/2012   NA 139 05/01/2014   K 3.9 05/01/2014   CL 108 05/01/2014   CREATININE 1.2 05/01/2014   BUN 18 05/01/2014   CO2 24 05/01/2014   TSH 2.61 05/01/2014   PSA 0.85 06/24/2012   INR 1.2 09/20/2008   HGBA1C 5.5 05/01/2014       Assessment & Plan:

## 2014-09-03 NOTE — Progress Notes (Signed)
Pre visit review using our clinic review tool, if applicable. No additional management support is needed unless otherwise documented below in the visit note. 

## 2014-09-03 NOTE — Assessment & Plan Note (Signed)
His BP is well controlled 

## 2014-09-03 NOTE — Assessment & Plan Note (Signed)
His renal function is stable/improved Will cont to avoid nephrotoxic agents Will keep good control of the BP

## 2014-09-03 NOTE — Assessment & Plan Note (Signed)

## 2014-09-03 NOTE — Patient Instructions (Signed)

## 2014-09-03 NOTE — Assessment & Plan Note (Signed)
He has achieved his LDL goal 

## 2014-09-06 ENCOUNTER — Encounter: Payer: Self-pay | Admitting: Internal Medicine

## 2014-11-01 ENCOUNTER — Encounter: Payer: Self-pay | Admitting: Cardiology

## 2014-11-01 ENCOUNTER — Ambulatory Visit (INDEPENDENT_AMBULATORY_CARE_PROVIDER_SITE_OTHER): Payer: PPO | Admitting: Cardiology

## 2014-11-01 VITALS — BP 164/100 | HR 71 | Ht 69.0 in | Wt 185.2 lb

## 2014-11-01 DIAGNOSIS — I10 Essential (primary) hypertension: Secondary | ICD-10-CM

## 2014-11-01 NOTE — Patient Instructions (Signed)
Your physician wants you to follow-up in: 1 Year. You will receive a reminder letter in the mail two months in advance. If you don't receive a letter, please call our office to schedule the follow-up appointment.  
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
## 2014-11-05 ENCOUNTER — Other Ambulatory Visit: Payer: Self-pay | Admitting: Internal Medicine

## 2014-11-12 ENCOUNTER — Other Ambulatory Visit: Payer: Self-pay | Admitting: Cardiology

## 2014-11-15 ENCOUNTER — Ambulatory Visit (INDEPENDENT_AMBULATORY_CARE_PROVIDER_SITE_OTHER): Payer: PPO | Admitting: Nurse Practitioner

## 2014-11-15 ENCOUNTER — Encounter: Payer: Self-pay | Admitting: Nurse Practitioner

## 2014-11-15 VITALS — BP 142/76 | HR 63 | Ht 69.0 in | Wt 183.5 lb

## 2014-11-15 DIAGNOSIS — R569 Unspecified convulsions: Secondary | ICD-10-CM | POA: Diagnosis not present

## 2014-11-15 DIAGNOSIS — G9389 Other specified disorders of brain: Secondary | ICD-10-CM | POA: Diagnosis not present

## 2014-11-15 NOTE — Patient Instructions (Signed)
Continue Keppra 500 mg twice daily Call for seizure activity Follow-up yearly and when necessary

## 2014-11-15 NOTE — Progress Notes (Signed)
I have reviewed and agreed above plan. 
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
## 2014-11-30 ENCOUNTER — Other Ambulatory Visit: Payer: Self-pay | Admitting: Cardiology

## 2014-11-30 NOTE — Telephone Encounter (Signed)
REFILL 

## 2014-12-03 ENCOUNTER — Encounter: Payer: Self-pay | Admitting: Gastroenterology

## 2015-01-07 ENCOUNTER — Encounter: Payer: Self-pay | Admitting: Internal Medicine

## 2015-01-07 ENCOUNTER — Ambulatory Visit (INDEPENDENT_AMBULATORY_CARE_PROVIDER_SITE_OTHER): Payer: PPO | Admitting: Internal Medicine

## 2015-01-07 ENCOUNTER — Other Ambulatory Visit (INDEPENDENT_AMBULATORY_CARE_PROVIDER_SITE_OTHER): Payer: PPO

## 2015-01-07 VITALS — BP 144/70 | HR 71 | Temp 98.8°F | Resp 16 | Ht 69.0 in | Wt 182.0 lb

## 2015-01-07 DIAGNOSIS — R739 Hyperglycemia, unspecified: Secondary | ICD-10-CM

## 2015-01-07 DIAGNOSIS — E7800 Pure hypercholesterolemia, unspecified: Secondary | ICD-10-CM

## 2015-01-07 DIAGNOSIS — I10 Essential (primary) hypertension: Secondary | ICD-10-CM

## 2015-01-07 DIAGNOSIS — H409 Unspecified glaucoma: Secondary | ICD-10-CM | POA: Diagnosis not present

## 2015-01-07 DIAGNOSIS — N400 Enlarged prostate without lower urinary tract symptoms: Secondary | ICD-10-CM | POA: Diagnosis not present

## 2015-01-07 DIAGNOSIS — R7989 Other specified abnormal findings of blood chemistry: Secondary | ICD-10-CM

## 2015-01-07 LAB — CBC WITH DIFFERENTIAL/PLATELET
BASOS ABS: 0.1 10*3/uL (ref 0.0–0.1)
Basophils Relative: 1 % (ref 0.0–3.0)
Eosinophils Absolute: 0.5 10*3/uL (ref 0.0–0.7)
Eosinophils Relative: 5.9 % — ABNORMAL HIGH (ref 0.0–5.0)
HEMATOCRIT: 44.4 % (ref 39.0–52.0)
Hemoglobin: 14.9 g/dL (ref 13.0–17.0)
LYMPHS PCT: 29.4 % (ref 12.0–46.0)
Lymphs Abs: 2.3 10*3/uL (ref 0.7–4.0)
MCHC: 33.5 g/dL (ref 30.0–36.0)
MCV: 91.6 fl (ref 78.0–100.0)
MONOS PCT: 11.4 % (ref 3.0–12.0)
Monocytes Absolute: 0.9 10*3/uL (ref 0.1–1.0)
NEUTROS ABS: 4.2 10*3/uL (ref 1.4–7.7)
Neutrophils Relative %: 52.3 % (ref 43.0–77.0)
PLATELETS: 203 10*3/uL (ref 150.0–400.0)
RBC: 4.85 Mil/uL (ref 4.22–5.81)
RDW: 12.9 % (ref 11.5–15.5)
WBC: 8 10*3/uL (ref 4.0–10.5)

## 2015-01-07 LAB — URINALYSIS, ROUTINE W REFLEX MICROSCOPIC
BILIRUBIN URINE: NEGATIVE
Hgb urine dipstick: NEGATIVE
Ketones, ur: NEGATIVE
LEUKOCYTES UA: NEGATIVE
Nitrite: NEGATIVE
PH: 5.5 (ref 5.0–8.0)
RBC / HPF: NONE SEEN (ref 0–?)
Specific Gravity, Urine: 1.02 (ref 1.000–1.030)
Total Protein, Urine: NEGATIVE
Urine Glucose: NEGATIVE
Urobilinogen, UA: 0.2 (ref 0.0–1.0)

## 2015-01-07 LAB — TSH: TSH: 2.05 u[IU]/mL (ref 0.35–4.50)

## 2015-01-07 LAB — LIPID PANEL
CHOL/HDL RATIO: 5
CHOLESTEROL: 128 mg/dL (ref 0–200)
HDL: 28.5 mg/dL — AB (ref >39.00)
NonHDL: 99.88
TRIGLYCERIDES: 201 mg/dL — AB (ref 0.0–149.0)
VLDL: 40.2 mg/dL — AB (ref 0.0–40.0)

## 2015-01-07 LAB — BASIC METABOLIC PANEL
BUN: 20 mg/dL (ref 6–23)
CALCIUM: 10.2 mg/dL (ref 8.4–10.5)
CHLORIDE: 106 meq/L (ref 96–112)
CO2: 23 mEq/L (ref 19–32)
CREATININE: 1.34 mg/dL (ref 0.40–1.50)
GFR: 67.14 mL/min (ref >60.00)
Glucose, Bld: 97 mg/dL (ref 70–99)
Potassium: 4.4 mEq/L (ref 3.5–5.1)
Sodium: 140 mEq/L (ref 135–145)

## 2015-01-07 LAB — PSA: PSA: 0.61 ng/mL (ref 0.10–4.00)

## 2015-01-07 LAB — LDL CHOLESTEROL, DIRECT: Direct LDL: 74 mg/dL

## 2015-01-07 LAB — HEMOGLOBIN A1C: HEMOGLOBIN A1C: 5.3 % (ref 4.6–6.5)

## 2015-01-07 MED ORDER — BIMATOPROST 0.03 % OP SOLN: 1.0000 [drp] | Freq: Every day | OPHTHALMIC | Status: DC

## 2015-01-07 NOTE — Progress Notes (Signed)
Pre visit review using our clinic review tool, if applicable. No additional management support is needed unless otherwise documented below in the visit note. 

## 2015-01-07 NOTE — Patient Instructions (Signed)
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
## 2015-01-08 ENCOUNTER — Encounter: Payer: Self-pay | Admitting: Internal Medicine

## 2015-03-10 ENCOUNTER — Other Ambulatory Visit: Payer: Self-pay | Admitting: Neurology

## 2015-06-21 DIAGNOSIS — H401132 Primary open-angle glaucoma, bilateral, moderate stage: Secondary | ICD-10-CM | POA: Diagnosis not present

## 2015-07-04 ENCOUNTER — Other Ambulatory Visit: Payer: Self-pay

## 2015-07-04 MED ORDER — IRBESARTAN 300 MG PO TABS: 300.0000 mg | ORAL_TABLET | Freq: Every day | ORAL | Status: DC

## 2015-07-10 ENCOUNTER — Encounter: Payer: Self-pay | Admitting: Internal Medicine

## 2015-07-10 ENCOUNTER — Ambulatory Visit (INDEPENDENT_AMBULATORY_CARE_PROVIDER_SITE_OTHER): Payer: PPO | Admitting: Internal Medicine

## 2015-07-10 VITALS — BP 150/86 | HR 68 | Temp 98.2°F | Resp 16 | Ht 69.0 in | Wt 186.0 lb

## 2015-07-10 DIAGNOSIS — Z23 Encounter for immunization: Secondary | ICD-10-CM | POA: Diagnosis not present

## 2015-07-10 DIAGNOSIS — I10 Essential (primary) hypertension: Secondary | ICD-10-CM

## 2015-07-10 NOTE — Progress Notes (Signed)
Pre visit review using our clinic review tool, if applicable. No additional management support is needed unless otherwise documented below in the visit note. 

## 2015-07-10 NOTE — Patient Instructions (Signed)
Hypertension Hypertension, commonly called high blood pressure, is when the force of blood pumping through your arteries is too strong. Your arteries are the blood vessels that carry blood from your heart throughout your body. A blood pressure reading consists of a higher number over a lower number, such as 110/72. The higher number (systolic) is the pressure inside your arteries when your heart pumps. The lower number (diastolic) is the pressure inside your arteries when your heart relaxes. Ideally you want your blood pressure below 120/80. Hypertension forces your heart to work harder to pump blood. Your arteries may become narrow or stiff. Having untreated or uncontrolled hypertension can cause heart attack, stroke, kidney disease, and other problems. RISK FACTORS Some risk factors for high blood pressure are controllable. Others are not.  Risk factors you cannot control include:   Race. You may be at higher risk if you are African American.  Age. Risk increases with age.  Gender. Men are at higher risk than women before age 45 years. After age 65, women are at higher risk than men. Risk factors you can control include:  Not getting enough exercise or physical activity.  Being overweight.  Getting too much fat, sugar, calories, or salt in your diet.  Drinking too much alcohol. SIGNS AND SYMPTOMS Hypertension does not usually cause signs or symptoms. Extremely high blood pressure (hypertensive crisis) may cause headache, anxiety, shortness of breath, and nosebleed. DIAGNOSIS To check if you have hypertension, your health care provider will measure your blood pressure while you are seated, with your arm held at the level of your heart. It should be measured at least twice using the same arm. Certain conditions can cause a difference in blood pressure between your right and left arms. A blood pressure reading that is higher than normal on one occasion does not mean that you need treatment. If  it is not clear whether you have high blood pressure, you may be asked to return on a different day to have your blood pressure checked again. Or, you may be asked to monitor your blood pressure at home for 1 or more weeks. TREATMENT Treating high blood pressure includes making lifestyle changes and possibly taking medicine. Living a healthy lifestyle can help lower high blood pressure. You may need to change some of your habits. Lifestyle changes may include:  Following the DASH diet. This diet is high in fruits, vegetables, and whole grains. It is low in salt, red meat, and added sugars.  Keep your sodium intake below 2,300 mg per day.  Getting at least 30-45 minutes of aerobic exercise at least 4 times per week.  Losing weight if necessary.  Not smoking.  Limiting alcoholic beverages.  Learning ways to reduce stress. Your health care provider may prescribe medicine if lifestyle changes are not enough to get your blood pressure under control, and if one of the following is true:  You are 18-59 years of age and your systolic blood pressure is above 140.  You are 60 years of age or older, and your systolic blood pressure is above 150.  Your diastolic blood pressure is above 90.  You have diabetes, and your systolic blood pressure is over 140 or your diastolic blood pressure is over 90.  You have kidney disease and your blood pressure is above 140/90.  You have heart disease and your blood pressure is above 140/90. Your personal target blood pressure may vary depending on your medical conditions, your age, and other factors. HOME CARE INSTRUCTIONS    Have your blood pressure rechecked as directed by your health care provider.   Take medicines only as directed by your health care provider. Follow the directions carefully. Blood pressure medicines must be taken as prescribed. The medicine does not work as well when you skip doses. Skipping doses also puts you at risk for  problems.  Do not smoke.   Monitor your blood pressure at home as directed by your health care provider. SEEK MEDICAL CARE IF:   You think you are having a reaction to medicines taken.  You have recurrent headaches or feel dizzy.  You have swelling in your ankles.  You have trouble with your vision. SEEK IMMEDIATE MEDICAL CARE IF:  You develop a severe headache or confusion.  You have unusual weakness, numbness, or feel faint.  You have severe chest or abdominal pain.  You vomit repeatedly.  You have trouble breathing. MAKE SURE YOU:   Understand these instructions.  Will watch your condition.  Will get help right away if you are not doing well or get worse.   This information is not intended to replace advice given to you by your health care provider. Make sure you discuss any questions you have with your health care provider.   Document Released: 05/04/2005 Document Revised: 09/18/2014 Document Reviewed: 02/24/2013 Elsevier Interactive Patient Education 2016 [employer]  
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
## 2015-09-17 DIAGNOSIS — H401132 Primary open-angle glaucoma, bilateral, moderate stage: Secondary | ICD-10-CM | POA: Diagnosis not present

## 2015-10-23 ENCOUNTER — Other Ambulatory Visit: Payer: Self-pay | Admitting: Nurse Practitioner
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
## 2015-11-11 ENCOUNTER — Ambulatory Visit (INDEPENDENT_AMBULATORY_CARE_PROVIDER_SITE_OTHER): Payer: PPO | Admitting: Cardiology

## 2015-11-11 ENCOUNTER — Encounter: Payer: Self-pay | Admitting: Cardiology

## 2015-11-11 VITALS — BP 132/80 | HR 64 | Ht 69.0 in | Wt 183.4 lb

## 2015-11-11 DIAGNOSIS — I10 Essential (primary) hypertension: Secondary | ICD-10-CM

## 2015-11-11 NOTE — Patient Instructions (Signed)
Medication Instructions:  DECREASE Aspirin 81 mg daily  Labwork: NONE  Testing/Procedures: NONE  Follow-Up: Your physician wants you to follow-up in: 1 Year. You will receive a reminder letter in the mail two months in advance. If you don't receive a letter, please call our office to schedule the follow-up appointment.   Any Other Special Instructions Will Be Listed Below (If Applicable).   If you need a refill on your cardiac medications before your next appointment, please call your pharmacy.   

## 2015-11-18 ENCOUNTER — Ambulatory Visit: Payer: PPO | Admitting: Nurse Practitioner

## 2015-11-18 ENCOUNTER — Other Ambulatory Visit: Payer: Self-pay | Admitting: *Deleted

## 2015-11-18 MED ORDER — AMLODIPINE BESYLATE 5 MG PO TABS: 5.0000 mg | ORAL_TABLET | Freq: Every day | ORAL | Status: DC

## 2015-11-20 ENCOUNTER — Ambulatory Visit (INDEPENDENT_AMBULATORY_CARE_PROVIDER_SITE_OTHER): Payer: PPO | Admitting: Nurse Practitioner

## 2015-11-20 ENCOUNTER — Encounter: Payer: Self-pay | Admitting: Nurse Practitioner

## 2015-11-20 VITALS — BP 152/94 | HR 72 | Ht 69.0 in | Wt 183.8 lb

## 2015-11-20 DIAGNOSIS — G459 Transient cerebral ischemic attack, unspecified: Secondary | ICD-10-CM

## 2015-11-20 DIAGNOSIS — R569 Unspecified convulsions: Secondary | ICD-10-CM

## 2015-11-20 MED ORDER — LEVETIRACETAM 500 MG PO TABS: 500.0000 mg | ORAL_TABLET | Freq: Two times a day (BID) | ORAL | Status: DC

## 2015-11-20 NOTE — Patient Instructions (Addendum)
Continue Keppra 500 mg twice daily will refill Call for seizure activity Follow-up yearly and when necessary
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
## 2015-11-21 ENCOUNTER — Other Ambulatory Visit: Payer: Self-pay

## 2015-11-21 MED ORDER — METOPROLOL SUCCINATE ER 100 MG PO TB24: 100.0000 mg | ORAL_TABLET | Freq: Every day | ORAL | Status: DC

## 2015-11-25 NOTE — Progress Notes (Signed)
I have reviewed and agreed above plan. 

## 2015-12-04 ENCOUNTER — Other Ambulatory Visit: Payer: Self-pay

## 2015-12-04 MED ORDER — SIMVASTATIN 10 MG PO TABS: 10.0000 mg | ORAL_TABLET | Freq: Every day | ORAL | Status: DC

## 2016-01-02 ENCOUNTER — Other Ambulatory Visit: Payer: Self-pay | Admitting: Internal Medicine

## 2016-01-07 ENCOUNTER — Encounter: Payer: Self-pay | Admitting: Internal Medicine

## 2016-01-07 ENCOUNTER — Ambulatory Visit (INDEPENDENT_AMBULATORY_CARE_PROVIDER_SITE_OTHER): Payer: PPO | Admitting: Internal Medicine

## 2016-01-07 ENCOUNTER — Other Ambulatory Visit (INDEPENDENT_AMBULATORY_CARE_PROVIDER_SITE_OTHER): Payer: PPO

## 2016-01-07 VITALS — BP 120/72 | HR 75 | Temp 98.3°F | Resp 16 | Ht 69.0 in | Wt 177.5 lb

## 2016-01-07 DIAGNOSIS — E781 Pure hyperglyceridemia: Secondary | ICD-10-CM

## 2016-01-07 DIAGNOSIS — Z23 Encounter for immunization: Secondary | ICD-10-CM | POA: Diagnosis not present

## 2016-01-07 DIAGNOSIS — E785 Hyperlipidemia, unspecified: Secondary | ICD-10-CM

## 2016-01-07 DIAGNOSIS — I10 Essential (primary) hypertension: Secondary | ICD-10-CM | POA: Diagnosis not present

## 2016-01-07 DIAGNOSIS — N522 Drug-induced erectile dysfunction: Secondary | ICD-10-CM

## 2016-01-07 DIAGNOSIS — R739 Hyperglycemia, unspecified: Secondary | ICD-10-CM

## 2016-01-07 DIAGNOSIS — Z0000 Encounter for general adult medical examination without abnormal findings: Secondary | ICD-10-CM

## 2016-01-07 LAB — COMPREHENSIVE METABOLIC PANEL
ALBUMIN: 4.5 g/dL (ref 3.5–5.2)
ALK PHOS: 91 U/L (ref 39–117)
ALT: 40 U/L (ref 0–53)
AST: 24 U/L (ref 0–37)
BUN: 23 mg/dL (ref 6–23)
CHLORIDE: 104 meq/L (ref 96–112)
CO2: 25 mEq/L (ref 19–32)
Calcium: 9.6 mg/dL (ref 8.4–10.5)
Creatinine, Ser: 1.64 mg/dL — ABNORMAL HIGH (ref 0.40–1.50)
GFR: 53.03 mL/min — AB (ref >60.00)
Glucose, Bld: 418 mg/dL — ABNORMAL HIGH (ref 70–99)
POTASSIUM: 4.5 meq/L (ref 3.5–5.1)
Sodium: 138 mEq/L (ref 135–145)
TOTAL PROTEIN: 7.6 g/dL (ref 6.0–8.3)
Total Bilirubin: 0.7 mg/dL (ref 0.2–1.2)

## 2016-01-07 LAB — TSH: TSH: 1.53 u[IU]/mL (ref 0.35–4.50)

## 2016-01-07 LAB — LIPID PANEL
CHOL/HDL RATIO: 6
CHOLESTEROL: 174 mg/dL (ref 0–200)
HDL: 28 mg/dL — AB (ref >39.00)

## 2016-01-07 LAB — LDL CHOLESTEROL, DIRECT: LDL DIRECT: 88 mg/dL

## 2016-01-07 MED ORDER — SILDENAFIL CITRATE 100 MG PO TABS: 50.0000 mg | ORAL_TABLET | Freq: Every day | ORAL | 11 refills | Status: DC | PRN

## 2016-01-07 NOTE — Patient Instructions (Signed)
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
## 2016-01-07 NOTE — Progress Notes (Signed)
Pre visit review using our clinic review tool, if applicable. No additional management support is needed unless otherwise documented below in the visit note. 

## 2016-01-08 ENCOUNTER — Encounter: Payer: Self-pay | Admitting: Internal Medicine

## 2016-01-08 DIAGNOSIS — E781 Pure hyperglyceridemia: Secondary | ICD-10-CM | POA: Insufficient documentation

## 2016-01-08 MED ORDER — OMEGA-3-ACID ETHYL ESTERS 1 G PO CAPS: 2.0000 g | ORAL_CAPSULE | Freq: Two times a day (BID) | ORAL | 11 refills | Status: DC

## 2016-01-08 NOTE — Assessment & Plan Note (Signed)

## 2016-01-13 ENCOUNTER — Other Ambulatory Visit (INDEPENDENT_AMBULATORY_CARE_PROVIDER_SITE_OTHER): Payer: PPO

## 2016-01-13 DIAGNOSIS — I10 Essential (primary) hypertension: Secondary | ICD-10-CM | POA: Diagnosis not present

## 2016-01-13 LAB — BASIC METABOLIC PANEL
BUN: 18 mg/dL (ref 6–23)
CALCIUM: 9.2 mg/dL (ref 8.4–10.5)
CHLORIDE: 106 meq/L (ref 96–112)
CO2: 24 mEq/L (ref 19–32)
CREATININE: 1.31 mg/dL (ref 0.40–1.50)
GFR: 68.73 mL/min (ref >60.00)
Glucose, Bld: 173 mg/dL — ABNORMAL HIGH (ref 70–99)
Potassium: 4.2 mEq/L (ref 3.5–5.1)
SODIUM: 138 meq/L (ref 135–145)

## 2016-01-14 ENCOUNTER — Other Ambulatory Visit: Payer: Self-pay | Admitting: Internal Medicine

## 2016-01-14 DIAGNOSIS — R739 Hyperglycemia, unspecified: Secondary | ICD-10-CM

## 2016-01-23 ENCOUNTER — Other Ambulatory Visit (INDEPENDENT_AMBULATORY_CARE_PROVIDER_SITE_OTHER): Payer: PPO

## 2016-01-23 DIAGNOSIS — R739 Hyperglycemia, unspecified: Secondary | ICD-10-CM

## 2016-01-23 LAB — HEMOGLOBIN A1C: Hgb A1c MFr Bld: 7.5 % — ABNORMAL HIGH (ref 4.6–6.5)

## 2016-03-24 ENCOUNTER — Other Ambulatory Visit: Payer: Self-pay | Admitting: Internal Medicine

## 2016-06-01 DIAGNOSIS — H401131 Primary open-angle glaucoma, bilateral, mild stage: Secondary | ICD-10-CM | POA: Diagnosis not present

## 2016-06-01 LAB — HM DIABETES EYE EXAM

## 2016-06-05 ENCOUNTER — Encounter: Payer: Self-pay | Admitting: Internal Medicine

## 2016-06-20 ENCOUNTER — Other Ambulatory Visit: Payer: Self-pay | Admitting: Internal Medicine

## 2016-07-08 ENCOUNTER — Other Ambulatory Visit (INDEPENDENT_AMBULATORY_CARE_PROVIDER_SITE_OTHER): Payer: PPO

## 2016-07-08 ENCOUNTER — Ambulatory Visit (INDEPENDENT_AMBULATORY_CARE_PROVIDER_SITE_OTHER): Payer: PPO | Admitting: Internal Medicine

## 2016-07-08 ENCOUNTER — Encounter: Payer: Self-pay | Admitting: Internal Medicine

## 2016-07-08 ENCOUNTER — Ambulatory Visit (INDEPENDENT_AMBULATORY_CARE_PROVIDER_SITE_OTHER)
Admission: RE | Admit: 2016-07-08 | Discharge: 2016-07-08 | Disposition: A | Discharge status: Home / Self Care | Payer: PPO | Source: Ambulatory Visit | Attending: Internal Medicine | Admitting: Internal Medicine

## 2016-07-08 VITALS — BP 140/80 | HR 77 | Temp 98.4°F | Resp 16 | Ht 69.0 in | Wt 175.2 lb

## 2016-07-08 DIAGNOSIS — E781 Pure hyperglyceridemia: Secondary | ICD-10-CM

## 2016-07-08 DIAGNOSIS — N183 Chronic kidney disease, stage 3 unspecified: Secondary | ICD-10-CM

## 2016-07-08 DIAGNOSIS — I10 Essential (primary) hypertension: Secondary | ICD-10-CM | POA: Diagnosis not present

## 2016-07-08 DIAGNOSIS — E118 Type 2 diabetes mellitus with unspecified complications: Secondary | ICD-10-CM | POA: Diagnosis not present

## 2016-07-08 DIAGNOSIS — G8929 Other chronic pain: Secondary | ICD-10-CM

## 2016-07-08 DIAGNOSIS — N400 Enlarged prostate without lower urinary tract symptoms: Secondary | ICD-10-CM | POA: Diagnosis not present

## 2016-07-08 DIAGNOSIS — E785 Hyperlipidemia, unspecified: Secondary | ICD-10-CM

## 2016-07-08 DIAGNOSIS — M545 Low back pain, unspecified: Secondary | ICD-10-CM

## 2016-07-08 LAB — COMPREHENSIVE METABOLIC PANEL
ALBUMIN: 4.8 g/dL (ref 3.5–5.2)
ALK PHOS: 57 U/L (ref 39–117)
ALT: 30 U/L (ref 0–53)
AST: 20 U/L (ref 0–37)
BILIRUBIN TOTAL: 0.9 mg/dL (ref 0.2–1.2)
BUN: 18 mg/dL (ref 6–23)
CHLORIDE: 109 meq/L (ref 96–112)
CO2: 26 mEq/L (ref 19–32)
CREATININE: 1.32 mg/dL (ref 0.40–1.50)
Calcium: 10.5 mg/dL (ref 8.4–10.5)
GFR: 68.04 mL/min (ref >60.00)
Glucose, Bld: 105 mg/dL — ABNORMAL HIGH (ref 70–99)
Potassium: 4.7 mEq/L (ref 3.5–5.1)
SODIUM: 142 meq/L (ref 135–145)
TOTAL PROTEIN: 7.2 g/dL (ref 6.0–8.3)

## 2016-07-08 LAB — CBC WITH DIFFERENTIAL/PLATELET
BASOS ABS: 0 10*3/uL (ref 0.0–0.1)
BASOS PCT: 0.6 % (ref 0.0–3.0)
EOS ABS: 0.2 10*3/uL (ref 0.0–0.7)
Eosinophils Relative: 2.7 % (ref 0.0–5.0)
HCT: 42.9 % (ref 39.0–52.0)
HEMOGLOBIN: 14.4 g/dL (ref 13.0–17.0)
Lymphocytes Relative: 31.7 % (ref 12.0–46.0)
Lymphs Abs: 2.6 10*3/uL (ref 0.7–4.0)
MCHC: 33.5 g/dL (ref 30.0–36.0)
MCV: 90.8 fl (ref 78.0–100.0)
MONO ABS: 1.1 10*3/uL — AB (ref 0.1–1.0)
Monocytes Relative: 12.7 % — ABNORMAL HIGH (ref 3.0–12.0)
Neutro Abs: 4.4 10*3/uL (ref 1.4–7.7)
Neutrophils Relative %: 52.3 % (ref 43.0–77.0)
Platelets: 229 10*3/uL (ref 150.0–400.0)
RBC: 4.72 Mil/uL (ref 4.22–5.81)
RDW: 12.9 % (ref 11.5–15.5)
WBC: 8.3 10*3/uL (ref 4.0–10.5)

## 2016-07-08 LAB — HEMOGLOBIN A1C: HEMOGLOBIN A1C: 5.1 % (ref 4.6–6.5)

## 2016-07-08 LAB — URINALYSIS, ROUTINE W REFLEX MICROSCOPIC
Bilirubin Urine: NEGATIVE
HGB URINE DIPSTICK: NEGATIVE
Ketones, ur: NEGATIVE
Leukocytes, UA: NEGATIVE
NITRITE: NEGATIVE
RBC / HPF: NONE SEEN (ref 0–?)
SPECIFIC GRAVITY, URINE: 1.02 (ref 1.000–1.030)
Total Protein, Urine: NEGATIVE
Urine Glucose: NEGATIVE
Urobilinogen, UA: 0.2 (ref 0.0–1.0)
pH: 5.5 (ref 5.0–8.0)

## 2016-07-08 LAB — LIPID PANEL
CHOLESTEROL: 148 mg/dL (ref 0–200)
HDL: 35.8 mg/dL — ABNORMAL LOW (ref >39.00)
LDL Cholesterol: 94 mg/dL (ref 0–99)
NonHDL: 111.84
TRIGLYCERIDES: 89 mg/dL (ref 0.0–149.0)
Total CHOL/HDL Ratio: 4
VLDL: 17.8 mg/dL (ref 0.0–40.0)

## 2016-07-08 LAB — MICROALBUMIN / CREATININE URINE RATIO
Creatinine,U: 167 mg/dL
MICROALB UR: 3.3 mg/dL — AB (ref 0.0–1.9)
MICROALB/CREAT RATIO: 2 mg/g (ref 0.0–30.0)

## 2016-07-08 LAB — TSH: TSH: 2.3 u[IU]/mL (ref 0.35–4.50)

## 2016-07-08 LAB — PSA: PSA: 0.86 ng/mL (ref 0.10–4.00)

## 2016-07-08 MED ORDER — HYDROCODONE-ACETAMINOPHEN 5-325 MG PO TABS: 1.0000 | ORAL_TABLET | Freq: Four times a day (QID) | ORAL | 0 refills | Status: DC | PRN

## 2016-07-08 NOTE — Progress Notes (Signed)
Pre visit review using our clinic review tool, if applicable. No additional management support is needed unless otherwise documented below in the visit note. 
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
## 2016-07-08 NOTE — Patient Instructions (Signed)

## 2016-09-03 DIAGNOSIS — H401132 Primary open-angle glaucoma, bilateral, moderate stage: Secondary | ICD-10-CM | POA: Diagnosis not present

## 2016-09-07 ENCOUNTER — Encounter: Payer: Self-pay | Admitting: Neurology

## 2016-09-07 ENCOUNTER — Encounter (INDEPENDENT_AMBULATORY_CARE_PROVIDER_SITE_OTHER): Payer: Self-pay

## 2016-09-07 ENCOUNTER — Ambulatory Visit (INDEPENDENT_AMBULATORY_CARE_PROVIDER_SITE_OTHER): Payer: PPO | Admitting: Neurology

## 2016-09-07 VITALS — BP 149/82 | HR 61 | Ht 69.0 in | Wt 170.8 lb

## 2016-09-07 DIAGNOSIS — R569 Unspecified convulsions: Secondary | ICD-10-CM

## 2016-09-07 DIAGNOSIS — E118 Type 2 diabetes mellitus with unspecified complications: Secondary | ICD-10-CM

## 2016-09-07 MED ORDER — DIVALPROEX SODIUM ER 500 MG PO TB24: 1000.0000 mg | ORAL_TABLET | Freq: Every day | ORAL | 11 refills | Status: DC
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
## 2016-09-08 LAB — HIV ANTIBODY (ROUTINE TESTING W REFLEX): HIV Screen 4th Generation wRfx: NONREACTIVE

## 2016-09-08 LAB — VITAMIN B12: VITAMIN B 12: 853 pg/mL (ref 232–1245)

## 2016-09-08 LAB — RPR: RPR: NONREACTIVE

## 2016-10-08 DIAGNOSIS — H401133 Primary open-angle glaucoma, bilateral, severe stage: Secondary | ICD-10-CM | POA: Diagnosis not present

## 2016-11-14 ENCOUNTER — Other Ambulatory Visit: Payer: Self-pay | Admitting: Cardiology

## 2016-11-23 ENCOUNTER — Ambulatory Visit: Payer: PPO | Admitting: Nurse Practitioner

## 2016-12-04 ENCOUNTER — Other Ambulatory Visit: Payer: Self-pay | Admitting: Cardiology

## 2016-12-10 ENCOUNTER — Telehealth: Payer: Self-pay

## 2016-12-10 DIAGNOSIS — H401132 Primary open-angle glaucoma, bilateral, moderate stage: Secondary | ICD-10-CM | POA: Diagnosis not present

## 2016-12-10 NOTE — Telephone Encounter (Signed)
Dr. Orson SlickBowman (patients Ophthalmologist) called and stated that there was a noticeable difference in patients memory and ability to recall events. Patient was not able to remember the letters on the eye chart. Dr. Orson SlickBowman stated he did not believe it to be a vision problem but may be more neurological or possibly another stroke.   Called and spoke to pt spouse Tony [name](Tony [name]) and asked how the patient was acting and how did he look. She stated that he did not seem any different and did not notice any changes that Dr. Orson SlickBowman spoke of. She denied pt having any slurred speech, dropping face, nausea, vomiting or slowed movement. The only change that she was able to offer was that pt could not remember where he put things.   I have pt scheduled for Monday to see PCP. Are there any other recommendations for pt?

## 2016-12-14 ENCOUNTER — Ambulatory Visit (INDEPENDENT_AMBULATORY_CARE_PROVIDER_SITE_OTHER): Payer: PPO | Admitting: Internal Medicine

## 2016-12-14 ENCOUNTER — Encounter: Payer: Self-pay | Admitting: Internal Medicine

## 2016-12-14 VITALS — BP 142/82 | HR 68 | Temp 99.0°F | Resp 16 | Ht 69.0 in | Wt 174.2 lb

## 2016-12-14 DIAGNOSIS — G9389 Other specified disorders of brain: Secondary | ICD-10-CM | POA: Diagnosis not present

## 2016-12-14 DIAGNOSIS — I10 Essential (primary) hypertension: Secondary | ICD-10-CM

## 2016-12-14 DIAGNOSIS — I69319 Unspecified symptoms and signs involving cognitive functions following cerebral infarction: Secondary | ICD-10-CM | POA: Diagnosis not present

## 2016-12-14 DIAGNOSIS — R4189 Other symptoms and signs involving cognitive functions and awareness: Secondary | ICD-10-CM

## 2016-12-14 NOTE — Patient Instructions (Signed)
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
## 2016-12-15 ENCOUNTER — Telehealth: Payer: Self-pay | Admitting: Neurology

## 2016-12-15 ENCOUNTER — Ambulatory Visit (INDEPENDENT_AMBULATORY_CARE_PROVIDER_SITE_OTHER)
Admission: RE | Admit: 2016-12-15 | Discharge: 2016-12-15 | Disposition: A | Discharge status: Home / Self Care | Payer: PPO | Source: Ambulatory Visit | Attending: Internal Medicine | Admitting: Internal Medicine

## 2016-12-15 DIAGNOSIS — R4189 Other symptoms and signs involving cognitive functions and awareness: Secondary | ICD-10-CM

## 2016-12-15 DIAGNOSIS — G9389 Other specified disorders of brain: Secondary | ICD-10-CM | POA: Diagnosis not present

## 2016-12-15 DIAGNOSIS — R413 Other amnesia: Secondary | ICD-10-CM | POA: Diagnosis not present

## 2016-12-15 DIAGNOSIS — I69319 Unspecified symptoms and signs involving cognitive functions following cerebral infarction: Secondary | ICD-10-CM | POA: Insufficient documentation

## 2016-12-15 MED ORDER — AMLODIPINE BESYLATE 5 MG PO TABS: 5.0000 mg | ORAL_TABLET | Freq: Every day | ORAL | 2 refills | Status: DC

## 2016-12-15 NOTE — Telephone Encounter (Signed)
Left message for a return call
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
## 2016-12-15 NOTE — Telephone Encounter (Signed)
Pt's wife called said he had CT of the brain today. She said Dr Yetta BarreJones said by looking at the CT scan, he is wanting to know if Dr Terrace ArabiaYan thinks she may need to see prior to his return OV with her in October. Please call her to discuss

## 2016-12-15 NOTE — Telephone Encounter (Signed)
Spoke to patient's wife - she is aware of Dr. Zannie CoveYan's response and would like her husband to come in earlier to see Dr. Terrace ArabiaYan.  He has been placed on the schedule for 12/17/16.

## 2016-12-16 ENCOUNTER — Other Ambulatory Visit: Payer: Self-pay | Admitting: Internal Medicine

## 2016-12-17 ENCOUNTER — Encounter: Payer: Self-pay | Admitting: Neurology

## 2016-12-17 ENCOUNTER — Ambulatory Visit (INDEPENDENT_AMBULATORY_CARE_PROVIDER_SITE_OTHER): Payer: PPO | Admitting: Neurology

## 2016-12-17 VITALS — BP 178/87 | HR 67 | Ht 69.0 in | Wt 175.0 lb

## 2016-12-17 DIAGNOSIS — Z9889 Other specified postprocedural states: Secondary | ICD-10-CM

## 2016-12-17 DIAGNOSIS — R4189 Other symptoms and signs involving cognitive functions and awareness: Secondary | ICD-10-CM

## 2016-12-17 DIAGNOSIS — R569 Unspecified convulsions: Secondary | ICD-10-CM | POA: Diagnosis not present

## 2016-12-17 DIAGNOSIS — G9389 Other specified disorders of brain: Secondary | ICD-10-CM

## 2016-12-17 MED ORDER — LAMOTRIGINE 25 MG PO TABS: 50.0000 mg | ORAL_TABLET | Freq: Two times a day (BID) | ORAL | 6 refills | Status: DC
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
## 2016-12-18 ENCOUNTER — Telehealth: Payer: Self-pay | Admitting: Neurology

## 2016-12-18 LAB — VALPROIC ACID LEVEL: VALPROIC ACID LVL: 107 ug/mL — AB (ref 50–100)

## 2016-12-18 NOTE — Telephone Encounter (Signed)
Please call patient: Depakote level was on the high side 107, this could be related the timing of the blood sample and the last dose of Depakote.  Please also check with patient after starting lamotrigine, is he more alert, or more sedated, any seizure like activity?

## 2016-12-24 NOTE — Telephone Encounter (Signed)
Spoke to wife.  Relayed the lab result on depakote level on the high side, (could be related to timing of blood sample taken).   Per wife no seizures and is not sedated.  Tolerating the lamotrigine.   Will call back as needed.

## 2016-12-29 ENCOUNTER — Ambulatory Visit (INDEPENDENT_AMBULATORY_CARE_PROVIDER_SITE_OTHER): Payer: PPO

## 2016-12-29 DIAGNOSIS — G9389 Other specified disorders of brain: Secondary | ICD-10-CM

## 2016-12-29 DIAGNOSIS — R4189 Other symptoms and signs involving cognitive functions and awareness: Secondary | ICD-10-CM

## 2016-12-29 DIAGNOSIS — R569 Unspecified convulsions: Secondary | ICD-10-CM

## 2016-12-29 DIAGNOSIS — Z9889 Other specified postprocedural states: Secondary | ICD-10-CM

## 2017-01-20 ENCOUNTER — Other Ambulatory Visit: Payer: Self-pay | Admitting: Internal Medicine

## 2017-01-20 DIAGNOSIS — E781 Pure hyperglyceridemia: Secondary | ICD-10-CM

## 2017-01-20 MED ORDER — OMEGA-3-ACID ETHYL ESTERS 1 G PO CAPS: 2.0000 g | ORAL_CAPSULE | Freq: Two times a day (BID) | ORAL | 11 refills | Status: DC
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
## 2017-01-25 ENCOUNTER — Encounter: Payer: Self-pay | Admitting: Internal Medicine

## 2017-01-25 ENCOUNTER — Ambulatory Visit (INDEPENDENT_AMBULATORY_CARE_PROVIDER_SITE_OTHER): Payer: PPO | Admitting: Internal Medicine

## 2017-01-25 VITALS — BP 130/88 | HR 61 | Temp 98.4°F | Ht 69.0 in | Wt 173.0 lb

## 2017-01-25 DIAGNOSIS — L219 Seborrheic dermatitis, unspecified: Secondary | ICD-10-CM | POA: Insufficient documentation

## 2017-01-25 DIAGNOSIS — Z23 Encounter for immunization: Secondary | ICD-10-CM

## 2017-01-25 DIAGNOSIS — I10 Essential (primary) hypertension: Secondary | ICD-10-CM | POA: Diagnosis not present

## 2017-01-25 DIAGNOSIS — B360 Pityriasis versicolor: Secondary | ICD-10-CM | POA: Diagnosis not present

## 2017-01-25 MED ORDER — CICLOPIROX 0.77 % EX GEL: 1.0000 | Freq: Two times a day (BID) | CUTANEOUS | 1 refills | Status: AC

## 2017-01-25 MED ORDER — DESONIDE 0.05 % EX LOTN: TOPICAL_LOTION | Freq: Two times a day (BID) | CUTANEOUS | 1 refills | Status: AC

## 2017-01-25 MED ORDER — FLUCONAZOLE 200 MG PO TABS: 400.0000 mg | ORAL_TABLET | Freq: Once | ORAL | 1 refills | Status: AC
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
## 2017-01-25 NOTE — Patient Instructions (Signed)
Seborrheic Dermatitis, Adult Seborrheic dermatitis is a skin disease that causes red, scaly patches. It usually occurs on the scalp, and it is often called dandruff. The patches may appear on other parts of the body. Skin patches tend to appear where there are many oil glands in the skin. Areas of the body that are commonly affected include:  Scalp.  Skin folds of the body.  Ears.  Eyebrows.  Neck.  Face.  Armpits.  The bearded area of men's faces.  The condition may come and go for no known reason, and it is often long-lasting (chronic). What are the causes? The cause of this condition is not known. What increases the risk? This condition is more likely to develop in people who:  Have certain conditions, such as: ? HIV (human immunodeficiency virus). ? AIDS (acquired immunodeficiency syndrome). ? Parkinson disease. ? Mood disorders, such as depression.  Are 40-60 years old.  What are the signs or symptoms? Symptoms of this condition include:  Thick scales on the scalp.  Redness on the face or in the armpits.  Skin that is flaky. The flakes may be white or yellow.  Skin that seems oily or dry but is not helped with moisturizers.  Itching or burning in the affected areas.  How is this diagnosed? This condition is diagnosed with a medical history and physical exam. A sample of your skin may be tested (skin biopsy). You may need to see a skin specialist (dermatologist). How is this treated? There is no cure for this condition, but treatment can help to manage the symptoms. You may get treatment to remove scales, lower the risk of skin infection, and reduce swelling or itching. Treatment may include:  Creams that reduce swelling and irritation (steroids).  Creams that reduce skin yeast.  Medicated shampoo, soaps, moisturizing creams, or ointments.  Medicated moisturizing creams or ointments.  Follow these instructions at home:  Apply over-the-counter and  prescription medicines only as told by your health care provider.  Use any medicated shampoo, soaps, skin creams, or ointments only as told by your health care provider.  Keep all follow-up visits as told by your health care provider. This is important. Contact a health care provider if:  Your symptoms do not improve with treatment.  Your symptoms get worse.  You have new symptoms. This information is not intended to replace advice given to you by your health care provider. Make sure you discuss any questions you have with your health care provider. Document Released: 05/04/2005 Document Revised: 11/22/2015 Document Reviewed: 08/22/2015 Elsevier Interactive Patient Education  2018 [employer]  

## 2017-02-02 ENCOUNTER — Emergency Department (HOSPITAL_COMMUNITY): Payer: PPO

## 2017-02-02 ENCOUNTER — Observation Stay (HOSPITAL_COMMUNITY)
Admission: EM | Admit: 2017-02-02 | Discharge: 2017-02-04 | Disposition: A | Discharge status: Home / Self Care | Payer: PPO | Attending: Family Medicine | Admitting: Family Medicine

## 2017-02-02 ENCOUNTER — Telehealth: Payer: Self-pay | Admitting: Neurology

## 2017-02-02 ENCOUNTER — Encounter (HOSPITAL_COMMUNITY): Payer: Self-pay | Admitting: Radiology

## 2017-02-02 ENCOUNTER — Telehealth: Payer: Self-pay | Admitting: Internal Medicine

## 2017-02-02 DIAGNOSIS — N183 Chronic kidney disease, stage 3 unspecified: Secondary | ICD-10-CM | POA: Diagnosis present

## 2017-02-02 DIAGNOSIS — W010XXA Fall on same level from slipping, tripping and stumbling without subsequent striking against object, initial encounter: Secondary | ICD-10-CM | POA: Diagnosis not present

## 2017-02-02 DIAGNOSIS — G40109 Localization-related (focal) (partial) symptomatic epilepsy and epileptic syndromes with simple partial seizures, not intractable, without status epilepticus: Secondary | ICD-10-CM | POA: Diagnosis not present

## 2017-02-02 DIAGNOSIS — Z87891 Personal history of nicotine dependence: Secondary | ICD-10-CM | POA: Diagnosis not present

## 2017-02-02 DIAGNOSIS — E785 Hyperlipidemia, unspecified: Secondary | ICD-10-CM | POA: Insufficient documentation

## 2017-02-02 DIAGNOSIS — Z79899 Other long term (current) drug therapy: Secondary | ICD-10-CM | POA: Insufficient documentation

## 2017-02-02 DIAGNOSIS — E1122 Type 2 diabetes mellitus with diabetic chronic kidney disease: Secondary | ICD-10-CM | POA: Insufficient documentation

## 2017-02-02 DIAGNOSIS — M795 Residual foreign body in soft tissue: Secondary | ICD-10-CM | POA: Insufficient documentation

## 2017-02-02 DIAGNOSIS — R410 Disorientation, unspecified: Secondary | ICD-10-CM

## 2017-02-02 DIAGNOSIS — I129 Hypertensive chronic kidney disease with stage 1 through stage 4 chronic kidney disease, or unspecified chronic kidney disease: Secondary | ICD-10-CM | POA: Diagnosis not present

## 2017-02-02 DIAGNOSIS — R0602 Shortness of breath: Secondary | ICD-10-CM | POA: Diagnosis not present

## 2017-02-02 DIAGNOSIS — Z7982 Long term (current) use of aspirin: Secondary | ICD-10-CM | POA: Diagnosis not present

## 2017-02-02 DIAGNOSIS — G40209 Localization-related (focal) (partial) symptomatic epilepsy and epileptic syndromes with complex partial seizures, not intractable, without status epilepticus: Secondary | ICD-10-CM | POA: Diagnosis not present

## 2017-02-02 DIAGNOSIS — Z8673 Personal history of transient ischemic attack (TIA), and cerebral infarction without residual deficits: Secondary | ICD-10-CM | POA: Diagnosis not present

## 2017-02-02 DIAGNOSIS — S199XXA Unspecified injury of neck, initial encounter: Secondary | ICD-10-CM | POA: Diagnosis not present

## 2017-02-02 DIAGNOSIS — Y9289 Other specified places as the place of occurrence of the external cause: Secondary | ICD-10-CM | POA: Insufficient documentation

## 2017-02-02 DIAGNOSIS — G9340 Encephalopathy, unspecified: Secondary | ICD-10-CM | POA: Diagnosis not present

## 2017-02-02 DIAGNOSIS — R4182 Altered mental status, unspecified: Secondary | ICD-10-CM | POA: Insufficient documentation

## 2017-02-02 DIAGNOSIS — R4189 Other symptoms and signs involving cognitive functions and awareness: Secondary | ICD-10-CM | POA: Diagnosis present

## 2017-02-02 DIAGNOSIS — I10 Essential (primary) hypertension: Secondary | ICD-10-CM | POA: Diagnosis present

## 2017-02-02 DIAGNOSIS — Z888 Allergy status to other drugs, medicaments and biological substances status: Secondary | ICD-10-CM | POA: Insufficient documentation

## 2017-02-02 DIAGNOSIS — G9341 Metabolic encephalopathy: Principal | ICD-10-CM | POA: Insufficient documentation

## 2017-02-02 DIAGNOSIS — S0990XA Unspecified injury of head, initial encounter: Secondary | ICD-10-CM | POA: Diagnosis not present

## 2017-02-02 DIAGNOSIS — E119 Type 2 diabetes mellitus without complications: Secondary | ICD-10-CM

## 2017-02-02 DIAGNOSIS — F0390 Unspecified dementia without behavioral disturbance: Secondary | ICD-10-CM | POA: Insufficient documentation

## 2017-02-02 DIAGNOSIS — Z66 Do not resuscitate: Secondary | ICD-10-CM | POA: Diagnosis not present

## 2017-02-02 DIAGNOSIS — R569 Unspecified convulsions: Secondary | ICD-10-CM

## 2017-02-02 DIAGNOSIS — G9389 Other specified disorders of brain: Secondary | ICD-10-CM

## 2017-02-02 HISTORY — DX: Unspecified dementia, unspecified severity, without behavioral disturbance, psychotic disturbance, mood disturbance, and anxiety: F03.90

## 2017-02-02 HISTORY — DX: Localization-related (focal) (partial) symptomatic epilepsy and epileptic syndromes with complex partial seizures, not intractable, without status epilepticus: G40.209

## 2017-02-02 HISTORY — DX: Other specified disorders of brain: G93.89

## 2017-02-02 LAB — URINALYSIS, ROUTINE W REFLEX MICROSCOPIC
Bilirubin Urine: NEGATIVE
Glucose, UA: NEGATIVE mg/dL
Hgb urine dipstick: NEGATIVE
Ketones, ur: 5 mg/dL — AB
Leukocytes, UA: NEGATIVE
Nitrite: NEGATIVE
Protein, ur: 30 mg/dL — AB
Specific Gravity, Urine: 1.019 (ref 1.005–1.030)
pH: 5 (ref 5.0–8.0)

## 2017-02-02 LAB — COMPREHENSIVE METABOLIC PANEL WITH GFR
ALT: 63 U/L (ref 17–63)
AST: 70 U/L — ABNORMAL HIGH (ref 15–41)
Albumin: 4.3 g/dL (ref 3.5–5.0)
Alkaline Phosphatase: 62 U/L (ref 38–126)
Anion gap: 12 (ref 5–15)
BUN: 16 mg/dL (ref 6–20)
CO2: 21 mmol/L — ABNORMAL LOW (ref 22–32)
Calcium: 10.1 mg/dL (ref 8.9–10.3)
Chloride: 106 mmol/L (ref 101–111)
Creatinine, Ser: 1.44 mg/dL — ABNORMAL HIGH (ref 0.61–1.24)
GFR calc Af Amer: 53 mL/min — ABNORMAL LOW
GFR calc non Af Amer: 46 mL/min — ABNORMAL LOW
Glucose, Bld: 114 mg/dL — ABNORMAL HIGH (ref 65–99)
Potassium: 4.3 mmol/L (ref 3.5–5.1)
Sodium: 139 mmol/L (ref 135–145)
Total Bilirubin: 1.3 mg/dL — ABNORMAL HIGH (ref 0.3–1.2)
Total Protein: 7.6 g/dL (ref 6.5–8.1)

## 2017-02-02 LAB — CBC
HCT: 45.2 % (ref 39.0–52.0)
Hemoglobin: 15.9 g/dL (ref 13.0–17.0)
MCH: 30.7 pg (ref 26.0–34.0)
MCHC: 35.2 g/dL (ref 30.0–36.0)
MCV: 87.3 fL (ref 78.0–100.0)
Platelets: 193 K/uL (ref 150–400)
RBC: 5.18 MIL/uL (ref 4.22–5.81)
RDW: 13.1 % (ref 11.5–15.5)
WBC: 14.6 K/uL — ABNORMAL HIGH (ref 4.0–10.5)

## 2017-02-02 LAB — CBG MONITORING, ED: GLUCOSE-CAPILLARY: 107 mg/dL — AB (ref 65–99)

## 2017-02-02 LAB — VALPROIC ACID LEVEL: Valproic Acid Lvl: 85 ug/mL (ref 50.0–100.0)

## 2017-02-02 MED ORDER — ONDANSETRON HCL 4 MG/2ML IJ SOLN: 4.0000 mg | Freq: Four times a day (QID) | INTRAMUSCULAR | Status: DC | PRN

## 2017-02-02 MED ORDER — OMEGA-3-ACID ETHYL ESTERS 1 G PO CAPS
2.0000 g | ORAL_CAPSULE | Freq: Two times a day (BID) | ORAL | Status: DC
  Administered 2017-02-03 – 2017-02-04 (×4): 2 g via ORAL
  Filled 2017-02-02 (×4): qty 2

## 2017-02-02 MED ORDER — ENOXAPARIN SODIUM 40 MG/0.4ML ~~LOC~~ SOLN
40.0000 mg | Freq: Every day | SUBCUTANEOUS | Status: DC
  Administered 2017-02-03 – 2017-02-04 (×2): 40 mg via SUBCUTANEOUS
  Filled 2017-02-02 (×3): qty 0.4

## 2017-02-02 MED ORDER — CLOTRIMAZOLE 1 % EX CREA
1.0000 "application " | TOPICAL_CREAM | Freq: Two times a day (BID) | CUTANEOUS | Status: DC
  Administered 2017-02-03 (×2): 1 via TOPICAL
  Filled 2017-02-02: qty 15

## 2017-02-02 MED ORDER — AMLODIPINE BESYLATE 5 MG PO TABS
5.0000 mg | ORAL_TABLET | Freq: Every day | ORAL | Status: DC
  Administered 2017-02-03 – 2017-02-04 (×2): 5 mg via ORAL
  Filled 2017-02-02 (×2): qty 1

## 2017-02-02 MED ORDER — DIVALPROEX SODIUM ER 500 MG PO TB24
1000.0000 mg | ORAL_TABLET | Freq: Every day | ORAL | Status: DC
  Administered 2017-02-03 (×2): 1000 mg via ORAL
  Filled 2017-02-02 (×2): qty 2

## 2017-02-02 MED ORDER — ACETAMINOPHEN 650 MG RE SUPP: 650.0000 mg | Freq: Four times a day (QID) | RECTAL | Status: DC | PRN

## 2017-02-02 MED ORDER — LORAZEPAM 2 MG/ML IJ SOLN
0.5000 mg | Freq: Once | INTRAMUSCULAR | Status: DC
  Filled 2017-02-02: qty 1

## 2017-02-02 MED ORDER — LACTATED RINGERS IV SOLN
INTRAVENOUS | Status: AC
  Administered 2017-02-03: via INTRAVENOUS

## 2017-02-02 MED ORDER — ASPIRIN 325 MG PO TABS
325.0000 mg | ORAL_TABLET | Freq: Every day | ORAL | Status: DC
  Administered 2017-02-03 – 2017-02-04 (×2): 325 mg via ORAL
  Filled 2017-02-02 (×2): qty 1

## 2017-02-02 MED ORDER — LATANOPROST 0.005 % OP SOLN
1.0000 [drp] | Freq: Every day | OPHTHALMIC | Status: DC
  Administered 2017-02-03 (×2): 1 [drp] via OPHTHALMIC
  Filled 2017-02-02: qty 2.5

## 2017-02-02 MED ORDER — ONDANSETRON HCL 4 MG PO TABS: 4.0000 mg | ORAL_TABLET | Freq: Four times a day (QID) | ORAL | Status: DC | PRN

## 2017-02-02 MED ORDER — METOPROLOL SUCCINATE ER 100 MG PO TB24
100.0000 mg | ORAL_TABLET | Freq: Every day | ORAL | Status: DC
Start: 2017-02-03 — End: 2017-02-04
  Administered 2017-02-03 – 2017-02-04 (×2): 100 mg via ORAL
  Filled 2017-02-02 (×2): qty 1

## 2017-02-02 MED ORDER — DOCUSATE SODIUM 100 MG PO CAPS
100.0000 mg | ORAL_CAPSULE | Freq: Two times a day (BID) | ORAL | Status: DC
  Administered 2017-02-03 – 2017-02-04 (×4): 100 mg via ORAL
  Filled 2017-02-02 (×4): qty 1

## 2017-02-02 MED ORDER — DORZOLAMIDE HCL-TIMOLOL MAL 2-0.5 % OP SOLN
1.0000 [drp] | Freq: Two times a day (BID) | OPHTHALMIC | Status: DC
  Administered 2017-02-03 – 2017-02-04 (×3): 1 [drp] via OPHTHALMIC
  Filled 2017-02-02: qty 10

## 2017-02-02 MED ORDER — ACETAMINOPHEN 325 MG PO TABS: 650.0000 mg | ORAL_TABLET | Freq: Four times a day (QID) | ORAL | Status: DC | PRN

## 2017-02-02 MED ORDER — HALOPERIDOL LACTATE 5 MG/ML IJ SOLN: 2.0000 mg | Freq: Four times a day (QID) | INTRAMUSCULAR | Status: DC | PRN

## 2017-02-02 NOTE — Telephone Encounter (Signed)
Patient had a history of left temporal encephalomalacia, is at high risk for seizure, per record, he is taking Depakote ER 500 mg 2 tablets every night, and lamotrigine 25 mg 2 tablets every night,  Give him a early follow-up with nurse practitioner, he may need higher dose of lamotrigine
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
## 2017-02-02 NOTE — ED Notes (Signed)
Admitting at bedside 

## 2017-02-02 NOTE — Telephone Encounter (Signed)
Pt wife asking for a call back, pt fell last night and this morning.  Wife states he is unable to help her and she is asking for a call back.  She does not know if she should take him to ED.  She has been encouraged to do so if she feels the need, please call
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
## 2017-02-02 NOTE — ED Notes (Signed)
RN attempted to give patient ordered Ativan, but patient begins stating, "I don't need anything to help me sleep, you can't give me something I don't want to get." Wife states she does want patient to receive medication as he was trying to get out of bed and is continuously ordering her to take him home because he doesn't want to be here. RN informed patient reason for medication and it is not to make him sleep, but is for his safety. Patient argues with this RN. Pt informed he must stay in the bed - verbalized understanding of this.
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
## 2017-02-02 NOTE — Telephone Encounter (Signed)
Pt wife called back to inform she called pt PCP and he advised wife to take pt to ED, so she is taking him to ED

## 2017-02-02 NOTE — ED Triage Notes (Addendum)
Pt has had a stroke in the past and fell last nite twice.  He is not talking with wife like normal and he is stiff and holding his left side stiff.  Usually he can help her and states she caught him several times starring into space.  Pt is on depakote for seizures.  Pt has a bruise to forehead over right eye and right jaw has bruise.  Pt has also been incontinent.Pt is able to talk to me and thinks it is  1942 which is normal.  Pt follows commands and denies pain.  Falls unwitnessed.

## 2017-02-02 NOTE — Progress Notes (Signed)
Patient admitted from ED to room [phone] s/p fall at home. Alert and oriented x3. VSS. Oriented to room and call bell. Tony [name]

## 2017-02-02 NOTE — ED Notes (Signed)
Pt's spouse reports pt restless, states, "I am worried about him falling tying to get out of this wheelchair." Pt's wife asking for pt to be placed in a room. This RN advised pt and spouse that no rooms available at this time.  Chrge RN made aware.

## 2017-02-02 NOTE — ED Notes (Signed)
Patient attempted to get out of bed. EMT redirected patient and pt became increasingly upset. Wife at bedside trying to keep him from getting up. Patient appears to be more confused than during this RN assessment about 1 hour ago. MD speaking with patient at this time, see new orders.
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
## 2017-02-02 NOTE — Telephone Encounter (Signed)
Spoke to his wife on HIPAA - she is taking him to the ED for further evaluation of injuries caused by falls occurring last night and this morning.  Reports him having visible bruising on his forehead, right eye and right cheek.  Says he is able to respond to her questions but just able to give short, non-descriptive answers.

## 2017-02-02 NOTE — Telephone Encounter (Signed)
Spoke to his wife - patient is still in ED waiting for testing.  She wishes for him to be seen this week.  He has been placed on Carolyn's schedule on 02/04/17.

## 2017-02-03 ENCOUNTER — Observation Stay (HOSPITAL_COMMUNITY): Payer: PPO

## 2017-02-03 DIAGNOSIS — G9389 Other specified disorders of brain: Secondary | ICD-10-CM | POA: Diagnosis not present

## 2017-02-03 DIAGNOSIS — R4189 Other symptoms and signs involving cognitive functions and awareness: Secondary | ICD-10-CM

## 2017-02-03 DIAGNOSIS — I10 Essential (primary) hypertension: Secondary | ICD-10-CM

## 2017-02-03 DIAGNOSIS — E1122 Type 2 diabetes mellitus with diabetic chronic kidney disease: Secondary | ICD-10-CM

## 2017-02-03 DIAGNOSIS — N183 Chronic kidney disease, stage 3 (moderate): Secondary | ICD-10-CM | POA: Diagnosis not present

## 2017-02-03 DIAGNOSIS — G40209 Localization-related (focal) (partial) symptomatic epilepsy and epileptic syndromes with complex partial seizures, not intractable, without status epilepticus: Secondary | ICD-10-CM | POA: Diagnosis not present

## 2017-02-03 DIAGNOSIS — G9340 Encephalopathy, unspecified: Secondary | ICD-10-CM

## 2017-02-03 DIAGNOSIS — E785 Hyperlipidemia, unspecified: Secondary | ICD-10-CM

## 2017-02-03 LAB — COMPREHENSIVE METABOLIC PANEL
ALBUMIN: 3.7 g/dL (ref 3.5–5.0)
ALT: 82 U/L — ABNORMAL HIGH (ref 17–63)
AST: 123 U/L — AB (ref 15–41)
Alkaline Phosphatase: 53 U/L (ref 38–126)
Anion gap: 9 (ref 5–15)
BILIRUBIN TOTAL: 1 mg/dL (ref 0.3–1.2)
BUN: 13 mg/dL (ref 6–20)
CO2: 22 mmol/L (ref 22–32)
Calcium: 9.6 mg/dL (ref 8.9–10.3)
Chloride: 109 mmol/L (ref 101–111)
Creatinine, Ser: 1.34 mg/dL — ABNORMAL HIGH (ref 0.61–1.24)
GFR calc Af Amer: 58 mL/min — ABNORMAL LOW (ref >60)
GFR calc non Af Amer: 50 mL/min — ABNORMAL LOW (ref >60)
GLUCOSE: 112 mg/dL — AB (ref 65–99)
Potassium: 4 mmol/L (ref 3.5–5.1)
SODIUM: 140 mmol/L (ref 135–145)
TOTAL PROTEIN: 6.9 g/dL (ref 6.5–8.1)

## 2017-02-03 LAB — CBC
HCT: 41.7 % (ref 39.0–52.0)
HEMOGLOBIN: 14.2 g/dL (ref 13.0–17.0)
MCH: 29.8 pg (ref 26.0–34.0)
MCHC: 34.1 g/dL (ref 30.0–36.0)
MCV: 87.6 fL (ref 78.0–100.0)
Platelets: 176 10*3/uL (ref 150–400)
RBC: 4.76 MIL/uL (ref 4.22–5.81)
RDW: 13.1 % (ref 11.5–15.5)
WBC: 11.5 10*3/uL — AB (ref 4.0–10.5)

## 2017-02-03 LAB — BASIC METABOLIC PANEL
ANION GAP: 11 (ref 5–15)
BUN: 17 mg/dL (ref 6–20)
CO2: 23 mmol/L (ref 22–32)
Calcium: 9.9 mg/dL (ref 8.9–10.3)
Chloride: 110 mmol/L (ref 101–111)
Creatinine, Ser: 1.52 mg/dL — ABNORMAL HIGH (ref 0.61–1.24)
GFR calc Af Amer: 50 mL/min — ABNORMAL LOW (ref >60)
GFR, EST NON AFRICAN AMERICAN: 43 mL/min — AB (ref >60)
Glucose, Bld: 102 mg/dL — ABNORMAL HIGH (ref 65–99)
POTASSIUM: 5.3 mmol/L — AB (ref 3.5–5.1)
SODIUM: 144 mmol/L (ref 135–145)

## 2017-02-03 LAB — HEPATIC FUNCTION PANEL
ALBUMIN: 4 g/dL (ref 3.5–5.0)
ALT: 87 U/L — ABNORMAL HIGH (ref 17–63)
Alkaline Phosphatase: 56 U/L (ref 38–126)
BILIRUBIN DIRECT: 1.2 mg/dL — AB (ref 0.1–0.5)
Indirect Bilirubin: 2.2 mg/dL — ABNORMAL HIGH (ref 0.3–0.9)
Total Bilirubin: 3.4 mg/dL — ABNORMAL HIGH (ref 0.3–1.2)
Total Protein: >12 g/dL — ABNORMAL HIGH (ref 6.5–8.1)

## 2017-02-03 LAB — TSH: TSH: 3.379 u[IU]/mL (ref 0.350–4.500)

## 2017-02-03 LAB — VITAMIN B12: Vitamin B-12: 1038 pg/mL — ABNORMAL HIGH (ref 180–914)

## 2017-02-03 LAB — AMMONIA: AMMONIA: 21 umol/L (ref 9–35)

## 2017-02-03 MED ORDER — SODIUM POLYSTYRENE SULFONATE 15 GM/60ML PO SUSP
15.0000 g | Freq: Once | ORAL | Status: AC
  Administered 2017-02-03: 15 g via ORAL
  Filled 2017-02-03: qty 60

## 2017-02-03 MED ORDER — LAMOTRIGINE 25 MG PO TABS
25.0000 mg | ORAL_TABLET | Freq: Every day | ORAL | Status: DC
  Administered 2017-02-03: 25 mg via ORAL
  Filled 2017-02-03: qty 1

## 2017-02-03 MED ORDER — LAMOTRIGINE 25 MG PO TABS: ORAL_TABLET | ORAL | 6 refills | Status: AC

## 2017-02-03 MED ORDER — LAMOTRIGINE 25 MG PO TABS
50.0000 mg | ORAL_TABLET | Freq: Every day | ORAL | Status: DC
  Administered 2017-02-03 – 2017-02-04 (×2): 50 mg via ORAL
  Filled 2017-02-03 (×2): qty 2
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
## 2017-02-03 NOTE — Progress Notes (Signed)
Spoke with Burna Mortimer, Child psychotherapist in emergency room. Home health is closed at this time. Patient would not be able to receive equipment tonight. Case manger on days will have to arrange in the morning.
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
## 2017-02-03 NOTE — Care Management Obs Status (Signed)
MEDICARE OBSERVATION STATUS NOTIFICATION   Patient Details  Name: Tony [name] MRN: [id number] Date of Birth: Sep 27, 1940   Medicare Observation Status Notification Given:  Yes  Explained to patient's spouse Valinda Hoar Plato via telephone [phone] , voiced understanding.  Kingsley Plan, RN 02/03/2017, 10:02 AM

## 2017-02-03 NOTE — Progress Notes (Signed)
PT Cancellation Note  Patient Details Name: Tony [name] MRN: [id number] DOB: Nov 07, 1940   Cancelled Treatment:    Reason Eval/Treat Not Completed: Medical issues which prohibited therapy Pt with current bed rest orders. Will follow up when bed rest orders removed and as schedule allows.   Gladys Damme, PT, DPT  Acute Rehabilitation Services  Pager: [phone]    [name] 02/03/2017, 2:08 PM

## 2017-02-03 NOTE — Progress Notes (Signed)
Spoke with patient and wife and informed them that it is not safe for the patient to go home without the necessary equipment.  Explained that patient having a history of falling it is in his best interest to wait until morning when the equipment will be available.  They understood and agreed for patient to stay overnight.  Will continue to monitor.
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
## 2017-02-03 NOTE — Progress Notes (Signed)
Bedside EEG 6n02, completed, results pending.
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
## 2017-02-04 ENCOUNTER — Ambulatory Visit (INDEPENDENT_AMBULATORY_CARE_PROVIDER_SITE_OTHER): Payer: PPO | Admitting: Nurse Practitioner

## 2017-02-04 ENCOUNTER — Encounter: Payer: Self-pay | Admitting: Nurse Practitioner

## 2017-02-04 VITALS — BP 145/85 | HR 69 | Ht 69.0 in | Wt 164.6 lb

## 2017-02-04 DIAGNOSIS — R269 Unspecified abnormalities of gait and mobility: Secondary | ICD-10-CM | POA: Diagnosis not present

## 2017-02-04 DIAGNOSIS — G40209 Localization-related (focal) (partial) symptomatic epilepsy and epileptic syndromes with complex partial seizures, not intractable, without status epilepticus: Secondary | ICD-10-CM | POA: Diagnosis not present

## 2017-02-04 DIAGNOSIS — R4182 Altered mental status, unspecified: Secondary | ICD-10-CM

## 2017-02-04 LAB — RPR: RPR Ser Ql: NONREACTIVE

## 2017-02-04 NOTE — Patient Instructions (Signed)
Continue Depakote at current dose  Continue Lamictal at current dose Continue with [facility] PT and OT ordered from hospital Use walker for safe ambulation Keep follow up appt for November

## 2017-02-04 NOTE — Progress Notes (Signed)
Discharge paperwork given and reviewed with patient's family. Patient is ready for discharge.

## 2017-02-04 NOTE — Care Management Note (Addendum)
Case Management Note  Patient Details  Name: Tony [name] MRN: [id number] Date of Birth: [birth year]  Subjective/Objective:                    Action/Plan:  Received order for HHPT/OT arranged through [facility]  OT recommending HHOT . Paged MD for order and updated face to face. Jermain with [facility] aware.  Spoke to patient's wife Tony [name] [phone] via phone. Confirmed face sheet information. Offered choice over phone, Tony [name] would like [facility]. Confirmed patient needs 3 in 1 and walker. Ordered DME from [facility]. Tony [name] on her way to hospital now to pick her husband up.  Expected Discharge Date:  02/03/17               Expected Discharge Plan:  Home w Home Health Services  In-House Referral:     Discharge planning Services  CM Consult  Post Acute Care Choice:  Durable Medical Equipment, Home Health Choice offered to:  Spouse  DME Arranged:  3-N-1, Walker rolling DME Agency:  [facility]  HH Arranged:  PT [facility] Agency:  [facility]  Status of Service:  Completed, signed off  If discussed at Long Length of Stay Meetings, dates discussed:    Additional Comments:  Kingsley Plan, RN 02/04/2017, 9:45 AM

## 2017-02-04 NOTE — Progress Notes (Signed)
Patient seen this morning, discussed with him and his wife plans for discharge. Unfortunately, couldn't get DME set up prior to discharge, so remained overnight. He remains stable for discharge with HH PT and OT (ordered) and DME (awaiting arrival). He has a follow up appointment with neurology later this afternoon.   Hazeline Junker, MD 02/04/2017 11:39 AM
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
## 2017-02-05 ENCOUNTER — Telehealth: Payer: Self-pay | Admitting: *Deleted

## 2017-02-05 DIAGNOSIS — R269 Unspecified abnormalities of gait and mobility: Secondary | ICD-10-CM | POA: Diagnosis not present

## 2017-02-05 NOTE — Progress Notes (Signed)
I have reviewed and agreed above plan. 

## 2017-02-05 NOTE — Telephone Encounter (Signed)
Transition Care Management Follow-up Telephone Call   Date discharged? 02/03/17   How have you been since you were released from the hospital? Called pt spoke w/wife she stated husband is doing alright   Do you understand why you were in the hospital? YES   Do you understand the discharge instructions? YES   Where were you discharged to? Home   Items Reviewed:  Medications reviewed: YES  Allergies reviewed: YES  Dietary changes reviewed: NO  Referrals reviewed: YES, wife states saw his neurologist on yesterday and everything was ok   Functional Questionnaire:   Activities of Daily Living (ADLs):   She states she are independent in the following: ambulation, bathing and hygiene, feeding, continence, grooming, toileting and dressing States he require assistance with the following: ambulation sometimes   Any transportation issues/concerns?: NO   Any patient concerns? NO   Confirmed importance and date/time of follow-up visits scheduled YES, appt made 02/10/17  Provider Appointment booked with Dr. Yetta Barre  Confirmed with patient if condition begins to worsen call PCP or go to the ER.  Patient was given the office number and encouraged to call back with question or concerns.  : YES

## 2017-02-08 DIAGNOSIS — G40209 Localization-related (focal) (partial) symptomatic epilepsy and epileptic syndromes with complex partial seizures, not intractable, without status epilepticus: Secondary | ICD-10-CM | POA: Diagnosis not present

## 2017-02-08 DIAGNOSIS — Z87891 Personal history of nicotine dependence: Secondary | ICD-10-CM | POA: Diagnosis not present

## 2017-02-08 DIAGNOSIS — Z87828 Personal history of other (healed) physical injury and trauma: Secondary | ICD-10-CM | POA: Diagnosis not present

## 2017-02-08 DIAGNOSIS — Z9181 History of falling: Secondary | ICD-10-CM | POA: Diagnosis not present

## 2017-02-08 DIAGNOSIS — F0390 Unspecified dementia without behavioral disturbance: Secondary | ICD-10-CM | POA: Diagnosis not present

## 2017-02-08 DIAGNOSIS — I129 Hypertensive chronic kidney disease with stage 1 through stage 4 chronic kidney disease, or unspecified chronic kidney disease: Secondary | ICD-10-CM | POA: Diagnosis not present

## 2017-02-08 DIAGNOSIS — N183 Chronic kidney disease, stage 3 (moderate): Secondary | ICD-10-CM | POA: Diagnosis not present

## 2017-02-08 DIAGNOSIS — G5621 Lesion of ulnar nerve, right upper limb: Secondary | ICD-10-CM | POA: Diagnosis not present

## 2017-02-08 DIAGNOSIS — G9389 Other specified disorders of brain: Secondary | ICD-10-CM | POA: Diagnosis not present

## 2017-02-08 DIAGNOSIS — Z8673 Personal history of transient ischemic attack (TIA), and cerebral infarction without residual deficits: Secondary | ICD-10-CM | POA: Diagnosis not present

## 2017-02-08 DIAGNOSIS — E1122 Type 2 diabetes mellitus with diabetic chronic kidney disease: Secondary | ICD-10-CM | POA: Diagnosis not present

## 2017-02-10 ENCOUNTER — Inpatient Hospital Stay: Payer: PPO | Admitting: Internal Medicine

## 2017-02-17 DIAGNOSIS — G40209 Localization-related (focal) (partial) symptomatic epilepsy and epileptic syndromes with complex partial seizures, not intractable, without status epilepticus: Secondary | ICD-10-CM | POA: Diagnosis not present

## 2017-02-17 DIAGNOSIS — G5621 Lesion of ulnar nerve, right upper limb: Secondary | ICD-10-CM | POA: Diagnosis not present

## 2017-02-17 DIAGNOSIS — E1122 Type 2 diabetes mellitus with diabetic chronic kidney disease: Secondary | ICD-10-CM | POA: Diagnosis not present

## 2017-02-17 DIAGNOSIS — I129 Hypertensive chronic kidney disease with stage 1 through stage 4 chronic kidney disease, or unspecified chronic kidney disease: Secondary | ICD-10-CM | POA: Diagnosis not present

## 2017-02-17 DIAGNOSIS — Z87891 Personal history of nicotine dependence: Secondary | ICD-10-CM | POA: Diagnosis not present

## 2017-02-17 DIAGNOSIS — Z87828 Personal history of other (healed) physical injury and trauma: Secondary | ICD-10-CM | POA: Diagnosis not present

## 2017-02-17 DIAGNOSIS — N183 Chronic kidney disease, stage 3 (moderate): Secondary | ICD-10-CM | POA: Diagnosis not present

## 2017-02-17 DIAGNOSIS — Z9181 History of falling: Secondary | ICD-10-CM | POA: Diagnosis not present

## 2017-02-17 DIAGNOSIS — F0390 Unspecified dementia without behavioral disturbance: Secondary | ICD-10-CM | POA: Diagnosis not present

## 2017-02-17 DIAGNOSIS — G9389 Other specified disorders of brain: Secondary | ICD-10-CM | POA: Diagnosis not present

## 2017-02-17 DIAGNOSIS — Z8673 Personal history of transient ischemic attack (TIA), and cerebral infarction without residual deficits: Secondary | ICD-10-CM | POA: Diagnosis not present

## 2017-02-18 ENCOUNTER — Telehealth: Payer: Self-pay | Admitting: Internal Medicine

## 2017-02-18 DIAGNOSIS — G8929 Other chronic pain: Secondary | ICD-10-CM

## 2017-02-18 DIAGNOSIS — M545 Low back pain, unspecified: Secondary | ICD-10-CM

## 2017-02-18 DIAGNOSIS — R4182 Altered mental status, unspecified: Secondary | ICD-10-CM

## 2017-02-18 DIAGNOSIS — R269 Unspecified abnormalities of gait and mobility: Secondary | ICD-10-CM

## 2017-02-18 DIAGNOSIS — N400 Enlarged prostate without lower urinary tract symptoms: Secondary | ICD-10-CM

## 2017-02-18 NOTE — Telephone Encounter (Signed)
Wife states PT from Home Health agency is suppose to be calling our office to let us know patients situation at home.  I went ahead and gathered some information from patient: Patient almost got choked on Depakote - would like to know if this come in liquid form? Patient has thick phlegm he is coughing up Patient is not following commands.  For example if spouse is trying to help patient get up in bed - patient will not help to pull himself up or if she asked him to stand he will not.  Spouse states he has now started to let her know when he needs to urinate and will allow assistance to bedside commode.   Spouse states that OT is suppose to come visit the home today. I asked spouse what Home Health has suggested and states they have not offered any suggestions right now.  Spouse states she would like equipment to help her at home with patient such as a lift for the bed.  Spouse states that she has to rely on son to help but is sometimes by her self with patient and it is hard to lift patient.  Spouse wanted to know if there was a Armed forces training and education officer that would come to house for evaluation and certification for equipment?  I do not know of any such agency.   Please follow up with spouse in regard.

## 2017-02-19 ENCOUNTER — Telehealth: Payer: Self-pay | Admitting: Neurology

## 2017-02-19 MED ORDER — DIVALPROEX SODIUM 125 MG PO CSDR: 500.0000 mg | DELAYED_RELEASE_CAPSULE | Freq: Two times a day (BID) | ORAL | 3 refills | Status: AC

## 2017-02-19 NOTE — Telephone Encounter (Signed)
ERROR

## 2017-02-19 NOTE — Telephone Encounter (Signed)
Physical Therapist Trey Paula called stating that there is a decline in the functioning of the pt and he wanted to knw if pt can be seen sooner than 11-05, I see there are 2 Botox time slots on 10-10.  If Dr Terrace Arabia would like to see pt before 11-05 Trey Paula could be contacted re: an appointment for an earlier date. Trey Paula can be reached at [phone]

## 2017-02-19 NOTE — Telephone Encounter (Signed)
I called pt, spoke with wife who has agreed to 10-10@ 1:30, made aware to check in at 1:00pm.  Pt  Wife states pt is having a difficult time swallowing the divalproex (DEPAKOTE ER) 500 MG 24 hr tablet, she would like to know if it comes in liquid because last time pt took it he chocked terribly.  Pt wife is asking for a call

## 2017-02-19 NOTE — Addendum Note (Signed)
Addended by: Candis Schatz I on: 02/19/2017 10:18 AM   Modules accepted: Orders

## 2017-02-19 NOTE — Telephone Encounter (Signed)
Spoke with Tony [name] and explained that Depakote comes in sprinkles, but not liquid.  Ok to use the sprinkles, but  b/c they don't last as long as the capsules, he will need to take  bid, instead of the 1,000mg  in capsule form that  he was taking at bedtime. Also, b/c the sprinkles are , he will need 4 capsules bid--this does not mean the dose is too large.  Rx. has been sent to [facility] and she will request pharmacist instructions on how to give the sprinkles, when she picks rx. up.  She will call us back if she has further questions/concerns/fim

## 2017-02-19 NOTE — Telephone Encounter (Signed)
It is Ok to put patient in my next available slot

## 2017-02-24 ENCOUNTER — Encounter: Payer: Self-pay | Admitting: Neurology

## 2017-02-24 ENCOUNTER — Ambulatory Visit (INDEPENDENT_AMBULATORY_CARE_PROVIDER_SITE_OTHER): Payer: PPO | Admitting: Neurology

## 2017-02-24 VITALS — BP 149/83 | HR 60 | Ht 69.0 in | Wt 164.0 lb

## 2017-02-24 DIAGNOSIS — G9389 Other specified disorders of brain: Secondary | ICD-10-CM

## 2017-02-24 DIAGNOSIS — G40209 Localization-related (focal) (partial) symptomatic epilepsy and epileptic syndromes with complex partial seizures, not intractable, without status epilepticus: Secondary | ICD-10-CM

## 2017-02-24 DIAGNOSIS — I69319 Unspecified symptoms and signs involving cognitive functions following cerebral infarction: Secondary | ICD-10-CM | POA: Diagnosis not present

## 2017-02-24 MED ORDER — LAMOTRIGINE 100 MG PO TBDP: 100.0000 mg | ORAL_TABLET | Freq: Two times a day (BID) | ORAL | 11 refills | Status: AC
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
## 2017-02-24 NOTE — Telephone Encounter (Signed)
Pt spouse informed that [facility] order has been entered.

## 2017-02-26 DIAGNOSIS — E1122 Type 2 diabetes mellitus with diabetic chronic kidney disease: Secondary | ICD-10-CM | POA: Diagnosis not present

## 2017-02-26 DIAGNOSIS — Z8673 Personal history of transient ischemic attack (TIA), and cerebral infarction without residual deficits: Secondary | ICD-10-CM | POA: Diagnosis not present

## 2017-02-26 DIAGNOSIS — Z87891 Personal history of nicotine dependence: Secondary | ICD-10-CM | POA: Diagnosis not present

## 2017-02-26 DIAGNOSIS — G40209 Localization-related (focal) (partial) symptomatic epilepsy and epileptic syndromes with complex partial seizures, not intractable, without status epilepticus: Secondary | ICD-10-CM | POA: Diagnosis not present

## 2017-02-26 DIAGNOSIS — N183 Chronic kidney disease, stage 3 (moderate): Secondary | ICD-10-CM | POA: Diagnosis not present

## 2017-02-26 DIAGNOSIS — G9389 Other specified disorders of brain: Secondary | ICD-10-CM | POA: Diagnosis not present

## 2017-02-26 DIAGNOSIS — F0390 Unspecified dementia without behavioral disturbance: Secondary | ICD-10-CM | POA: Diagnosis not present

## 2017-02-26 DIAGNOSIS — G5621 Lesion of ulnar nerve, right upper limb: Secondary | ICD-10-CM | POA: Diagnosis not present

## 2017-02-26 DIAGNOSIS — Z87828 Personal history of other (healed) physical injury and trauma: Secondary | ICD-10-CM | POA: Diagnosis not present

## 2017-02-26 DIAGNOSIS — I129 Hypertensive chronic kidney disease with stage 1 through stage 4 chronic kidney disease, or unspecified chronic kidney disease: Secondary | ICD-10-CM | POA: Diagnosis not present

## 2017-02-26 DIAGNOSIS — Z9181 History of falling: Secondary | ICD-10-CM | POA: Diagnosis not present

## 2017-03-02 ENCOUNTER — Telehealth: Payer: Self-pay | Admitting: Internal Medicine

## 2017-03-02 NOTE — Telephone Encounter (Signed)
Ok with me 

## 2017-03-02 NOTE — Telephone Encounter (Signed)
Donita from [facility] called requesting orders for Speech and Occupational Therapy evaluations.

## 2017-03-02 NOTE — Telephone Encounter (Signed)
Notified Donita w/MD response...Raechel Chute

## 2017-03-04 ENCOUNTER — Telehealth: Payer: Self-pay | Admitting: Internal Medicine

## 2017-03-04 DIAGNOSIS — I129 Hypertensive chronic kidney disease with stage 1 through stage 4 chronic kidney disease, or unspecified chronic kidney disease: Secondary | ICD-10-CM | POA: Diagnosis not present

## 2017-03-04 DIAGNOSIS — Z9181 History of falling: Secondary | ICD-10-CM | POA: Diagnosis not present

## 2017-03-04 DIAGNOSIS — G5621 Lesion of ulnar nerve, right upper limb: Secondary | ICD-10-CM | POA: Diagnosis not present

## 2017-03-04 DIAGNOSIS — Z87828 Personal history of other (healed) physical injury and trauma: Secondary | ICD-10-CM | POA: Diagnosis not present

## 2017-03-04 DIAGNOSIS — N183 Chronic kidney disease, stage 3 (moderate): Secondary | ICD-10-CM | POA: Diagnosis not present

## 2017-03-04 DIAGNOSIS — G40209 Localization-related (focal) (partial) symptomatic epilepsy and epileptic syndromes with complex partial seizures, not intractable, without status epilepticus: Secondary | ICD-10-CM | POA: Diagnosis not present

## 2017-03-04 DIAGNOSIS — F0390 Unspecified dementia without behavioral disturbance: Secondary | ICD-10-CM | POA: Diagnosis not present

## 2017-03-04 DIAGNOSIS — Z87891 Personal history of nicotine dependence: Secondary | ICD-10-CM | POA: Diagnosis not present

## 2017-03-04 DIAGNOSIS — Z8673 Personal history of transient ischemic attack (TIA), and cerebral infarction without residual deficits: Secondary | ICD-10-CM | POA: Diagnosis not present

## 2017-03-04 DIAGNOSIS — G9389 Other specified disorders of brain: Secondary | ICD-10-CM | POA: Diagnosis not present

## 2017-03-04 DIAGNOSIS — E1122 Type 2 diabetes mellitus with diabetic chronic kidney disease: Secondary | ICD-10-CM | POA: Diagnosis not present

## 2017-03-04 NOTE — Telephone Encounter (Signed)
Needs verbals for speech therapy for 1week2

## 2017-03-04 NOTE — Telephone Encounter (Signed)
ok 

## 2017-03-05 NOTE — Telephone Encounter (Signed)
lvm with Boneta LucksJenny - verbal okay given.

## 2017-03-08 ENCOUNTER — Telehealth: Payer: Self-pay | Admitting: Neurology

## 2017-03-08 NOTE — Telephone Encounter (Signed)
[facility] a [facility] and wheelchair but has not heard back and requested she call and check on it. Please call [facility].

## 2017-03-08 NOTE — Telephone Encounter (Signed)
Our office has not received his paperwork.  Called [facility] [phone], [phone]) and requested orders be faxed.  Orders received, reviewed/signed by Dr. Terrace ArabiaYan, faxed and [facility] to [facility].  Pt's wife aware this has been completed.

## 2017-03-09 ENCOUNTER — Ambulatory Visit: Payer: PPO | Admitting: Nurse Practitioner

## 2017-03-11 DIAGNOSIS — Z87891 Personal history of nicotine dependence: Secondary | ICD-10-CM | POA: Diagnosis not present

## 2017-03-11 DIAGNOSIS — G40209 Localization-related (focal) (partial) symptomatic epilepsy and epileptic syndromes with complex partial seizures, not intractable, without status epilepticus: Secondary | ICD-10-CM | POA: Diagnosis not present

## 2017-03-11 DIAGNOSIS — N183 Chronic kidney disease, stage 3 (moderate): Secondary | ICD-10-CM | POA: Diagnosis not present

## 2017-03-11 DIAGNOSIS — F0390 Unspecified dementia without behavioral disturbance: Secondary | ICD-10-CM | POA: Diagnosis not present

## 2017-03-11 DIAGNOSIS — Z87828 Personal history of other (healed) physical injury and trauma: Secondary | ICD-10-CM | POA: Diagnosis not present

## 2017-03-11 DIAGNOSIS — G5621 Lesion of ulnar nerve, right upper limb: Secondary | ICD-10-CM | POA: Diagnosis not present

## 2017-03-11 DIAGNOSIS — G9389 Other specified disorders of brain: Secondary | ICD-10-CM | POA: Diagnosis not present

## 2017-03-11 DIAGNOSIS — Z9181 History of falling: Secondary | ICD-10-CM | POA: Diagnosis not present

## 2017-03-11 DIAGNOSIS — E1122 Type 2 diabetes mellitus with diabetic chronic kidney disease: Secondary | ICD-10-CM | POA: Diagnosis not present

## 2017-03-11 DIAGNOSIS — I129 Hypertensive chronic kidney disease with stage 1 through stage 4 chronic kidney disease, or unspecified chronic kidney disease: Secondary | ICD-10-CM | POA: Diagnosis not present

## 2017-03-11 DIAGNOSIS — Z8673 Personal history of transient ischemic attack (TIA), and cerebral infarction without residual deficits: Secondary | ICD-10-CM | POA: Diagnosis not present

## 2017-03-12 ENCOUNTER — Emergency Department (HOSPITAL_COMMUNITY)
Admission: EM | Admit: 2017-03-12 | Discharge: 2017-03-13 | Disposition: A | Discharge status: Home / Self Care | Payer: PPO | Attending: Emergency Medicine | Admitting: Emergency Medicine

## 2017-03-12 ENCOUNTER — Emergency Department (HOSPITAL_COMMUNITY): Payer: PPO

## 2017-03-12 ENCOUNTER — Encounter (HOSPITAL_COMMUNITY): Payer: Self-pay | Admitting: Emergency Medicine

## 2017-03-12 DIAGNOSIS — R627 Adult failure to thrive: Secondary | ICD-10-CM | POA: Diagnosis not present

## 2017-03-12 DIAGNOSIS — R569 Unspecified convulsions: Secondary | ICD-10-CM | POA: Insufficient documentation

## 2017-03-12 DIAGNOSIS — G40909 Epilepsy, unspecified, not intractable, without status epilepticus: Secondary | ICD-10-CM | POA: Diagnosis not present

## 2017-03-12 DIAGNOSIS — R0982 Postnasal drip: Secondary | ICD-10-CM | POA: Diagnosis not present

## 2017-03-12 DIAGNOSIS — R0981 Nasal congestion: Secondary | ICD-10-CM | POA: Insufficient documentation

## 2017-03-12 DIAGNOSIS — R030 Elevated blood-pressure reading, without diagnosis of hypertension: Secondary | ICD-10-CM | POA: Diagnosis not present

## 2017-03-12 DIAGNOSIS — R05 Cough: Secondary | ICD-10-CM | POA: Diagnosis not present

## 2017-03-12 NOTE — ED Triage Notes (Signed)
Per EMS pt lives at home with wife who takes care of him. Hx of TBI, seizures, with increased seizures x 2 days.  VSS.  Pt at baseline mental status. Pt's wife states he hasn't been eating well lately and decreased LOC.  Pt also reportedly had 2 seizures this morning, focal, without evaluation.

## 2017-03-13 ENCOUNTER — Emergency Department (HOSPITAL_COMMUNITY): Payer: PPO

## 2017-03-13 DIAGNOSIS — N183 Chronic kidney disease, stage 3 (moderate): Secondary | ICD-10-CM | POA: Diagnosis not present

## 2017-03-13 DIAGNOSIS — R627 Adult failure to thrive: Secondary | ICD-10-CM | POA: Diagnosis not present

## 2017-03-13 DIAGNOSIS — R4182 Altered mental status, unspecified: Secondary | ICD-10-CM | POA: Diagnosis not present

## 2017-03-13 DIAGNOSIS — R4181 Age-related cognitive decline: Secondary | ICD-10-CM | POA: Diagnosis not present

## 2017-03-13 DIAGNOSIS — R05 Cough: Secondary | ICD-10-CM | POA: Diagnosis not present

## 2017-03-13 DIAGNOSIS — R269 Unspecified abnormalities of gait and mobility: Secondary | ICD-10-CM | POA: Diagnosis not present

## 2017-03-13 DIAGNOSIS — R569 Unspecified convulsions: Secondary | ICD-10-CM | POA: Diagnosis not present

## 2017-03-13 LAB — COMPREHENSIVE METABOLIC PANEL
ALK PHOS: 60 U/L (ref 38–126)
ALT: 31 U/L (ref 17–63)
AST: 27 U/L (ref 15–41)
Albumin: 3.5 g/dL (ref 3.5–5.0)
Anion gap: 10 (ref 5–15)
BILIRUBIN TOTAL: 1.1 mg/dL (ref 0.3–1.2)
BUN: 12 mg/dL (ref 6–20)
CO2: 23 mmol/L (ref 22–32)
CREATININE: 1.47 mg/dL — AB (ref 0.61–1.24)
Calcium: 9.5 mg/dL (ref 8.9–10.3)
Chloride: 106 mmol/L (ref 101–111)
GFR calc Af Amer: 52 mL/min — ABNORMAL LOW (ref >60)
GFR, EST NON AFRICAN AMERICAN: 45 mL/min — AB (ref >60)
Glucose, Bld: 125 mg/dL — ABNORMAL HIGH (ref 65–99)
POTASSIUM: 4 mmol/L (ref 3.5–5.1)
SODIUM: 139 mmol/L (ref 135–145)
TOTAL PROTEIN: 7.1 g/dL (ref 6.5–8.1)

## 2017-03-13 LAB — CBC WITH DIFFERENTIAL/PLATELET
Basophils Absolute: 0 10*3/uL (ref 0.0–0.1)
Basophils Relative: 0 %
Eosinophils Absolute: 0.1 10*3/uL (ref 0.0–0.7)
Eosinophils Relative: 1 %
HEMATOCRIT: 39.9 % (ref 39.0–52.0)
HEMOGLOBIN: 14.3 g/dL (ref 13.0–17.0)
LYMPHS ABS: 3.1 10*3/uL (ref 0.7–4.0)
LYMPHS PCT: 27 %
MCH: 31.1 pg (ref 26.0–34.0)
MCHC: 35.8 g/dL (ref 30.0–36.0)
MCV: 86.7 fL (ref 78.0–100.0)
Monocytes Absolute: 1.2 10*3/uL — ABNORMAL HIGH (ref 0.1–1.0)
Monocytes Relative: 10 %
NEUTROS ABS: 6.9 10*3/uL (ref 1.7–7.7)
NEUTROS PCT: 62 %
Platelets: 266 10*3/uL (ref 150–400)
RBC: 4.6 MIL/uL (ref 4.22–5.81)
RDW: 12.9 % (ref 11.5–15.5)
WBC: 11.3 10*3/uL — AB (ref 4.0–10.5)

## 2017-03-13 LAB — URINALYSIS, ROUTINE W REFLEX MICROSCOPIC
BILIRUBIN URINE: NEGATIVE
Glucose, UA: NEGATIVE mg/dL
HGB URINE DIPSTICK: NEGATIVE
Ketones, ur: NEGATIVE mg/dL
Leukocytes, UA: NEGATIVE
NITRITE: NEGATIVE
PH: 5 (ref 5.0–8.0)
Protein, ur: NEGATIVE mg/dL
SPECIFIC GRAVITY, URINE: 1.017 (ref 1.005–1.030)

## 2017-03-13 LAB — AMMONIA: Ammonia: 25 umol/L (ref 9–35)

## 2017-03-13 LAB — CBG MONITORING, ED: Glucose-Capillary: 119 mg/dL — ABNORMAL HIGH (ref 65–99)

## 2017-03-13 LAB — VALPROIC ACID LEVEL: Valproic Acid Lvl: 42 ug/mL — ABNORMAL LOW (ref 50.0–100.0)

## 2017-03-13 LAB — I-STAT TROPONIN, ED: TROPONIN I, POC: 0 ng/mL (ref 0.00–0.08)

## 2017-03-13 LAB — I-STAT CG4 LACTIC ACID, ED: LACTIC ACID, VENOUS: 1.87 mmol/L (ref 0.5–1.9)

## 2017-03-13 MED ORDER — VALPROATE SODIUM 500 MG/5ML IV SOLN
500.0000 mg | Freq: Once | INTRAVENOUS | Status: AC
  Administered 2017-03-13: 500 mg via INTRAVENOUS
  Filled 2017-03-13: qty 5

## 2017-03-13 NOTE — ED Provider Notes (Signed)
76 yo M with a chief complaint of increased seizure activity.  This been going on for the past couple days.  Recently had his medications increased about 2 weeks ago.  The patient is currently back to baseline on my exam.  He had a workup that had no infectious finding.  On my exam he has some nasal congestion and posterior nasal drip.  I discussed the case with the neurologist on-call Dr. Laurence SlateAroor, with his low Depakote level recommended giving a load of 500 mg and then changing his dose to 500 mg in the morning and 250 at night.  Suggested they discuss it with their outpatient neurologist as well.   Melene PlanFloyd, Jaina Morin, DO 03/13/17 [phone]

## 2017-03-13 NOTE — ED Notes (Signed)
Pt's wife given urinal to assist pt with sample

## 2017-03-13 NOTE — ED Notes (Signed)
RN attempted IV without success. 
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
## 2017-03-13 NOTE — Discharge Instructions (Signed)
Increase your depakote to 500mg  in the morning and 250 in the evening.  Call your neurologist to see if they have further instructions.

## 2017-03-13 NOTE — ED Notes (Signed)
Attempted IV without success.

## 2017-03-15 ENCOUNTER — Ambulatory Visit (INDEPENDENT_AMBULATORY_CARE_PROVIDER_SITE_OTHER): Payer: PPO | Admitting: Neurology

## 2017-03-15 DIAGNOSIS — I69319 Unspecified symptoms and signs involving cognitive functions following cerebral infarction: Secondary | ICD-10-CM

## 2017-03-15 DIAGNOSIS — G40209 Localization-related (focal) (partial) symptomatic epilepsy and epileptic syndromes with complex partial seizures, not intractable, without status epilepticus: Secondary | ICD-10-CM

## 2017-03-15 DIAGNOSIS — G9389 Other specified disorders of brain: Secondary | ICD-10-CM

## 2017-03-15 LAB — LAMOTRIGINE LEVEL: LAMOTRIGINE LVL: 11.5 ug/mL (ref 2.0–20.0)

## 2017-03-16 DIAGNOSIS — F0390 Unspecified dementia without behavioral disturbance: Secondary | ICD-10-CM | POA: Diagnosis not present

## 2017-03-16 DIAGNOSIS — Z87891 Personal history of nicotine dependence: Secondary | ICD-10-CM | POA: Diagnosis not present

## 2017-03-16 DIAGNOSIS — N183 Chronic kidney disease, stage 3 (moderate): Secondary | ICD-10-CM | POA: Diagnosis not present

## 2017-03-16 DIAGNOSIS — E1122 Type 2 diabetes mellitus with diabetic chronic kidney disease: Secondary | ICD-10-CM | POA: Diagnosis not present

## 2017-03-16 DIAGNOSIS — Z87828 Personal history of other (healed) physical injury and trauma: Secondary | ICD-10-CM | POA: Diagnosis not present

## 2017-03-16 DIAGNOSIS — Z9181 History of falling: Secondary | ICD-10-CM | POA: Diagnosis not present

## 2017-03-16 DIAGNOSIS — I129 Hypertensive chronic kidney disease with stage 1 through stage 4 chronic kidney disease, or unspecified chronic kidney disease: Secondary | ICD-10-CM | POA: Diagnosis not present

## 2017-03-16 DIAGNOSIS — G5621 Lesion of ulnar nerve, right upper limb: Secondary | ICD-10-CM | POA: Diagnosis not present

## 2017-03-16 DIAGNOSIS — Z8673 Personal history of transient ischemic attack (TIA), and cerebral infarction without residual deficits: Secondary | ICD-10-CM | POA: Diagnosis not present

## 2017-03-16 DIAGNOSIS — G40209 Localization-related (focal) (partial) symptomatic epilepsy and epileptic syndromes with complex partial seizures, not intractable, without status epilepticus: Secondary | ICD-10-CM | POA: Diagnosis not present

## 2017-03-16 DIAGNOSIS — G9389 Other specified disorders of brain: Secondary | ICD-10-CM | POA: Diagnosis not present

## 2017-03-18 ENCOUNTER — Telehealth: Payer: Self-pay | Admitting: Internal Medicine

## 2017-03-18 DIAGNOSIS — I129 Hypertensive chronic kidney disease with stage 1 through stage 4 chronic kidney disease, or unspecified chronic kidney disease: Secondary | ICD-10-CM | POA: Diagnosis not present

## 2017-03-18 DIAGNOSIS — Z9181 History of falling: Secondary | ICD-10-CM | POA: Diagnosis not present

## 2017-03-18 DIAGNOSIS — F0390 Unspecified dementia without behavioral disturbance: Secondary | ICD-10-CM | POA: Diagnosis not present

## 2017-03-18 DIAGNOSIS — G5621 Lesion of ulnar nerve, right upper limb: Secondary | ICD-10-CM | POA: Diagnosis not present

## 2017-03-18 DIAGNOSIS — N183 Chronic kidney disease, stage 3 (moderate): Secondary | ICD-10-CM | POA: Diagnosis not present

## 2017-03-18 DIAGNOSIS — G40209 Localization-related (focal) (partial) symptomatic epilepsy and epileptic syndromes with complex partial seizures, not intractable, without status epilepticus: Secondary | ICD-10-CM | POA: Diagnosis not present

## 2017-03-18 DIAGNOSIS — Z8673 Personal history of transient ischemic attack (TIA), and cerebral infarction without residual deficits: Secondary | ICD-10-CM | POA: Diagnosis not present

## 2017-03-18 DIAGNOSIS — E1122 Type 2 diabetes mellitus with diabetic chronic kidney disease: Secondary | ICD-10-CM | POA: Diagnosis not present

## 2017-03-18 DIAGNOSIS — Z87828 Personal history of other (healed) physical injury and trauma: Secondary | ICD-10-CM | POA: Diagnosis not present

## 2017-03-18 DIAGNOSIS — G9389 Other specified disorders of brain: Secondary | ICD-10-CM | POA: Diagnosis not present

## 2017-03-18 DIAGNOSIS — Z87891 Personal history of nicotine dependence: Secondary | ICD-10-CM | POA: Diagnosis not present

## 2017-03-18 NOTE — Telephone Encounter (Signed)
yes

## 2017-03-18 NOTE — Telephone Encounter (Signed)
Tony LarssonJill from [facility] called requesting orders for a palliative care consult.
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
## 2017-03-18 NOTE — Telephone Encounter (Signed)
Notified Tony LarssonJill w/MD response...Raechel Chute/lmb

## 2017-03-22 ENCOUNTER — Telehealth: Payer: Self-pay | Admitting: Neurology

## 2017-03-22 ENCOUNTER — Ambulatory Visit: Payer: PPO | Admitting: Neurology

## 2017-03-22 DIAGNOSIS — Z87828 Personal history of other (healed) physical injury and trauma: Secondary | ICD-10-CM | POA: Diagnosis not present

## 2017-03-22 DIAGNOSIS — E1122 Type 2 diabetes mellitus with diabetic chronic kidney disease: Secondary | ICD-10-CM | POA: Diagnosis not present

## 2017-03-22 DIAGNOSIS — Z8673 Personal history of transient ischemic attack (TIA), and cerebral infarction without residual deficits: Secondary | ICD-10-CM | POA: Diagnosis not present

## 2017-03-22 DIAGNOSIS — G40209 Localization-related (focal) (partial) symptomatic epilepsy and epileptic syndromes with complex partial seizures, not intractable, without status epilepticus: Secondary | ICD-10-CM | POA: Diagnosis not present

## 2017-03-22 DIAGNOSIS — G5621 Lesion of ulnar nerve, right upper limb: Secondary | ICD-10-CM | POA: Diagnosis not present

## 2017-03-22 DIAGNOSIS — Z9181 History of falling: Secondary | ICD-10-CM | POA: Diagnosis not present

## 2017-03-22 DIAGNOSIS — F0390 Unspecified dementia without behavioral disturbance: Secondary | ICD-10-CM | POA: Diagnosis not present

## 2017-03-22 DIAGNOSIS — G9389 Other specified disorders of brain: Secondary | ICD-10-CM | POA: Diagnosis not present

## 2017-03-22 DIAGNOSIS — Z87891 Personal history of nicotine dependence: Secondary | ICD-10-CM | POA: Diagnosis not present

## 2017-03-22 DIAGNOSIS — I129 Hypertensive chronic kidney disease with stage 1 through stage 4 chronic kidney disease, or unspecified chronic kidney disease: Secondary | ICD-10-CM | POA: Diagnosis not present

## 2017-03-22 DIAGNOSIS — N183 Chronic kidney disease, stage 3 (moderate): Secondary | ICD-10-CM | POA: Diagnosis not present

## 2017-03-22 NOTE — Telephone Encounter (Signed)
Pt wife(unable to locate a DPR, but wife is listed as a contact) is asking for a call with the results of the EEG once they are available

## 2017-03-22 NOTE — Telephone Encounter (Signed)
CONCLUSION: This is a an abnormal awake and asleep .  There is evidence of moderate to severe background slowing, consistent with severe bi hemisphere malfunction, there is no evidence of epileptiform discharges.  Dr. Terrace ArabiaYan has reviewed the EEG results and ask that I call the patient.  Spoke to his wife on HIPAA - she is aware of results.  He is now under palliative care at home.

## 2017-03-25 ENCOUNTER — Telehealth: Payer: Self-pay | Admitting: Neurology

## 2017-03-25 ENCOUNTER — Telehealth: Payer: Self-pay | Admitting: *Deleted

## 2017-03-25 DIAGNOSIS — F0390 Unspecified dementia without behavioral disturbance: Secondary | ICD-10-CM | POA: Diagnosis not present

## 2017-03-25 NOTE — Telephone Encounter (Signed)
I spoke with Talbert ForestShirley who is with pallative care.  She is asking about pt status.  Is this his  baseline, (bed bound, speaks some, aspiration risk, eating pureed and 3 boost daily, has lost weight, decubitus in buttock and sacral area).  Less the 6 months to live?  Need Hospice.  I relayed to her that from Dr. Catalina GravelYans last note that this was a rapid delcine in pts status.  Dr. Yetta BarreJones pcp placed palliative care referral, he may be the person to contact for order.  I would be glad to ask Dr, Terrace ArabiaYan.
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
## 2017-03-25 NOTE — Telephone Encounter (Signed)
Received approval for for DME for this pt. (hospital bed, gel mattress, trapeze bed mount, and transporter chair).  [facility] HP approval for 03-12-17 thru 04-12-2018.   # [id number]. Member ID # [id number].  [phone], [phone].

## 2017-03-26 ENCOUNTER — Telehealth: Payer: Self-pay | Admitting: Internal Medicine

## 2017-03-26 DIAGNOSIS — G40209 Localization-related (focal) (partial) symptomatic epilepsy and epileptic syndromes with complex partial seizures, not intractable, without status epilepticus: Secondary | ICD-10-CM

## 2017-03-26 DIAGNOSIS — R4189 Other symptoms and signs involving cognitive functions and awareness: Secondary | ICD-10-CM

## 2017-03-26 DIAGNOSIS — I69319 Unspecified symptoms and signs involving cognitive functions following cerebral infarction: Secondary | ICD-10-CM

## 2017-03-26 NOTE — Telephone Encounter (Signed)
[place]ed [place] and gave verbal okay for wound care per evaluation recommendation.

## 2017-03-26 NOTE — Telephone Encounter (Signed)
States she is trying to determine if patient is right for hospice or hospice with palliative care.  Is requesting call back as soon as possible.  States patient is completley bed bound. Only intake is boast.  Not really eating solid foods.  High aspiration risk.

## 2017-03-26 NOTE — Telephone Encounter (Signed)
Spoke to Hospice NP Talbert Forest(Shirley) she states the following:  Pt has had a severe decline since recent seizure event that resulted in ED visit (September).   Pt does have a decubitus ulcer on buttock and left heel. (orders for wound care are in place).   Decrease in BP 90's over 50's. Spouse is holding BP meds at this time. Pt is not able to eat. Spouse has been feeding him strawfull of Boost at a time in which patient does choke. Delayed responses and slowed speech, weight loss, appearing to have petite mal seizures, aspiration risk, confusion, non ambulatory and rapid decline is noted.     Pt spouse is okay with Hospice. Verbal given for Hospice referral per Dr. Lawerance BachBurns in lieu  of Dr. Yetta BarreJones.    Called spouse and informed that referral was called in. Also found the number for medical transport. Spouse is not able to get patient in without transport assistance. [facility] [phone]).  Transport did state that insurance does not pay for travel to the doctor, only for wound care and for dialysis.

## 2017-03-26 NOTE — Telephone Encounter (Signed)
Noreene LarssonJill, from [facility]  [phone]  Need verbal for  Wound care  home health aid

## 2017-03-29 ENCOUNTER — Ambulatory Visit: Payer: PPO | Admitting: Internal Medicine

## 2017-04-13 DIAGNOSIS — R269 Unspecified abnormalities of gait and mobility: Secondary | ICD-10-CM | POA: Diagnosis not present

## 2017-04-13 DIAGNOSIS — E119 Type 2 diabetes mellitus without complications: Secondary | ICD-10-CM | POA: Diagnosis not present

## 2017-04-13 DIAGNOSIS — R4181 Age-related cognitive decline: Secondary | ICD-10-CM | POA: Diagnosis not present

## 2017-04-13 DIAGNOSIS — N183 Chronic kidney disease, stage 3 (moderate): Secondary | ICD-10-CM | POA: Diagnosis not present

## 2017-04-27 ENCOUNTER — Telehealth: Payer: Self-pay | Admitting: Internal Medicine

## 2017-05-03 ENCOUNTER — Telehealth: Payer: Self-pay

## 2017-05-03 NOTE — Telephone Encounter (Signed)
On 05/03/17 I [facility] a [facility] (original). The d/c is for cremation. The patient is a patient of Doctor Yetta BarreJones. The d/c will be taken to [facility] this am for signature.  On 05/03/17 I received the d/c back from Doctor Yetta BarreJones. I got the d/c ready and called the funeral home to let them know the d/c is ready for pickup.

## 2017-05-18 NOTE — Telephone Encounter (Signed)
Copied from CRM [id number]. Topic: General - Other >> Apr 27, 2017 11:37 AM Leafy Roobinson, Norma J wrote: Reason for CRM: [facility] [place] [facility] calling to let md know the patient died today at 751025am. Archie Pattenonya rn phone #[phone]

## 2017-05-18 DEATH — deceased

## 2017-05-19 ENCOUNTER — Ambulatory Visit: Payer: PPO | Admitting: Neurology

## 2017-08-24 IMAGING — DX DG LUMBAR SPINE COMPLETE 4+V
5 series · 5 of 5 positions shown · non-contrast
Comparison: 05/21/2013 abdominal CT

CLINICAL DATA: Low back pain for 3 months

EXAM:
LUMBAR SPINE - COMPLETE 4+ VIEW

[l-spine ap]
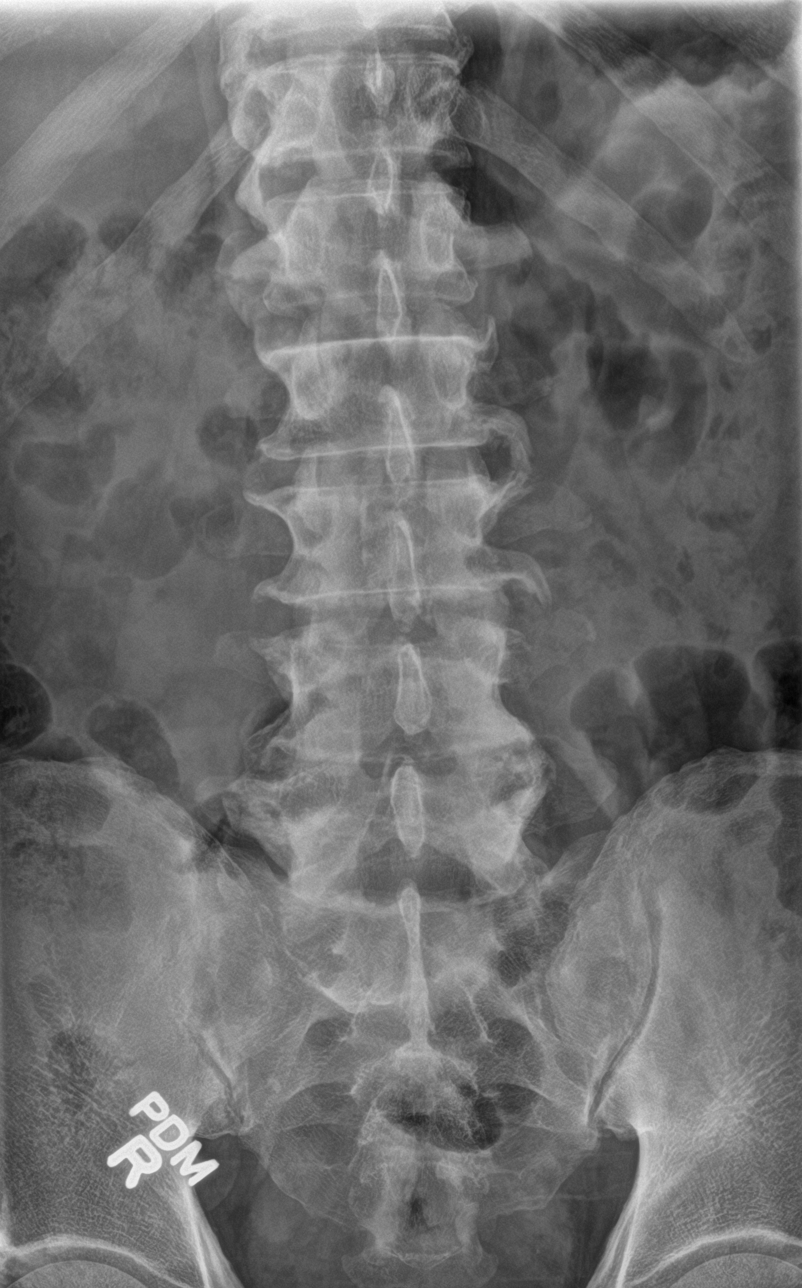

[l-spine obl (1 of 2)]
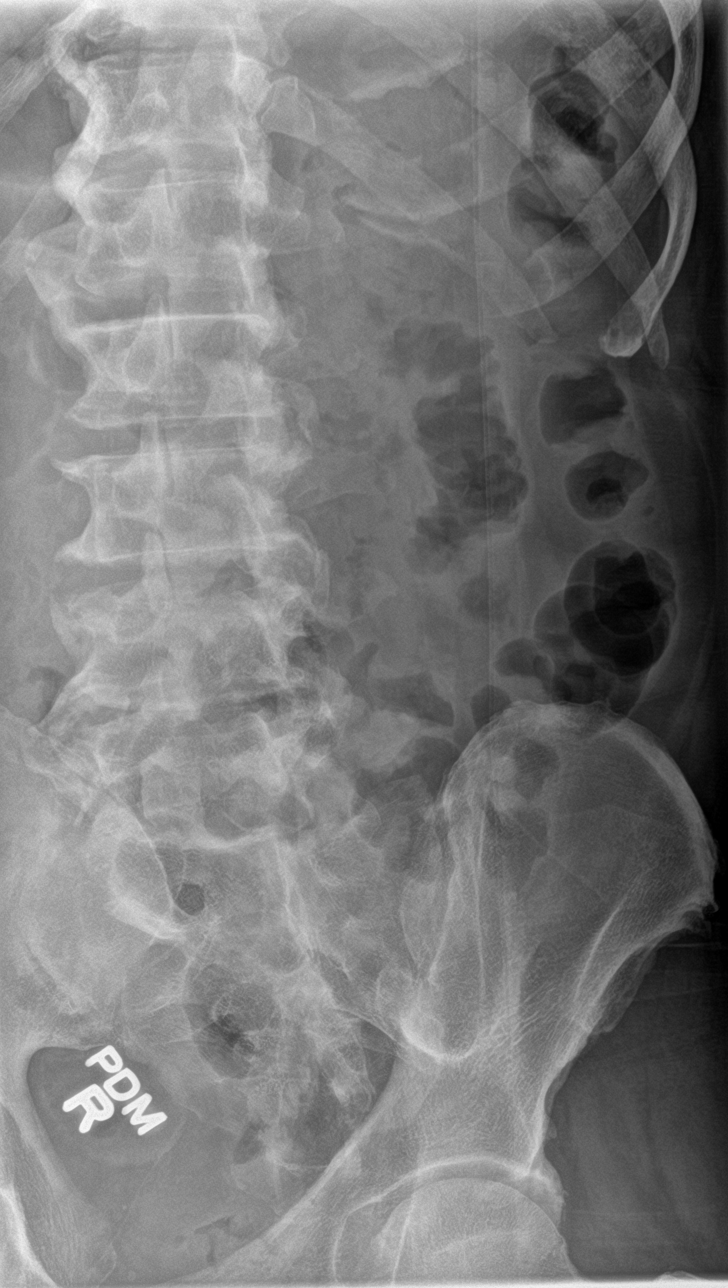

[l-spine obl (2 of 2)]
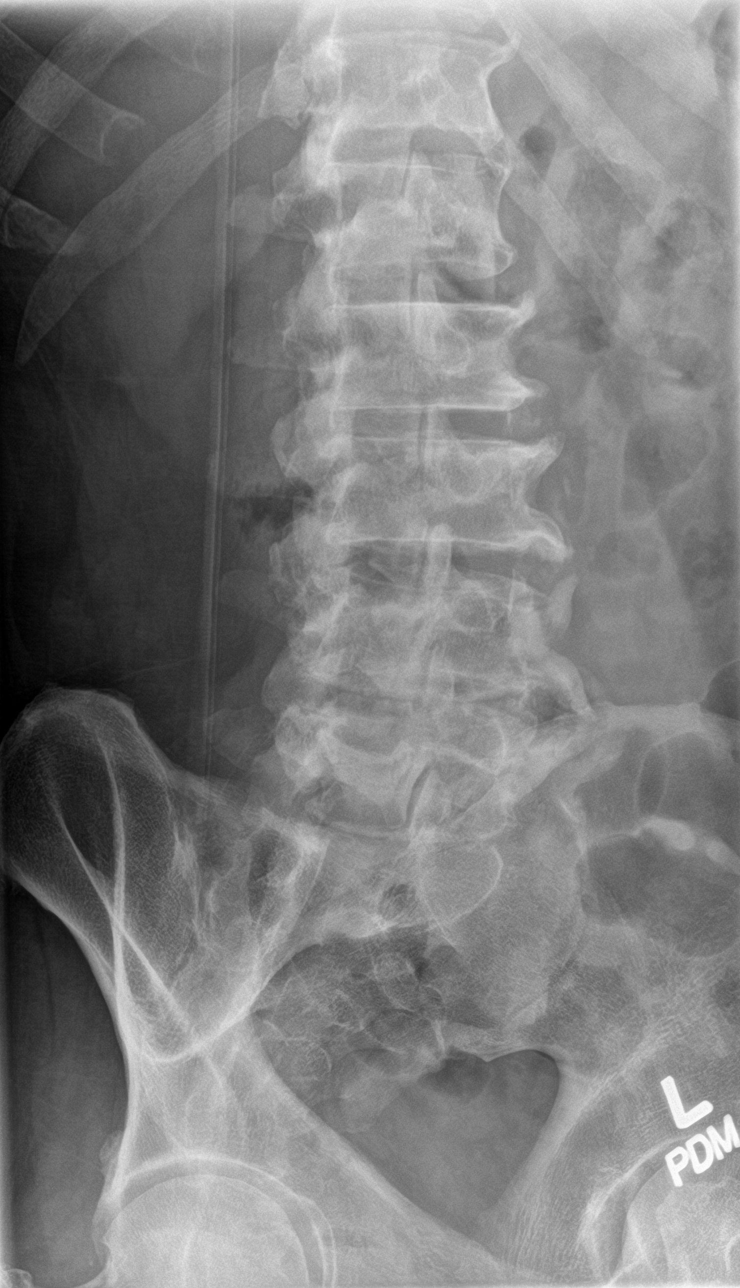

[l-spine lat]
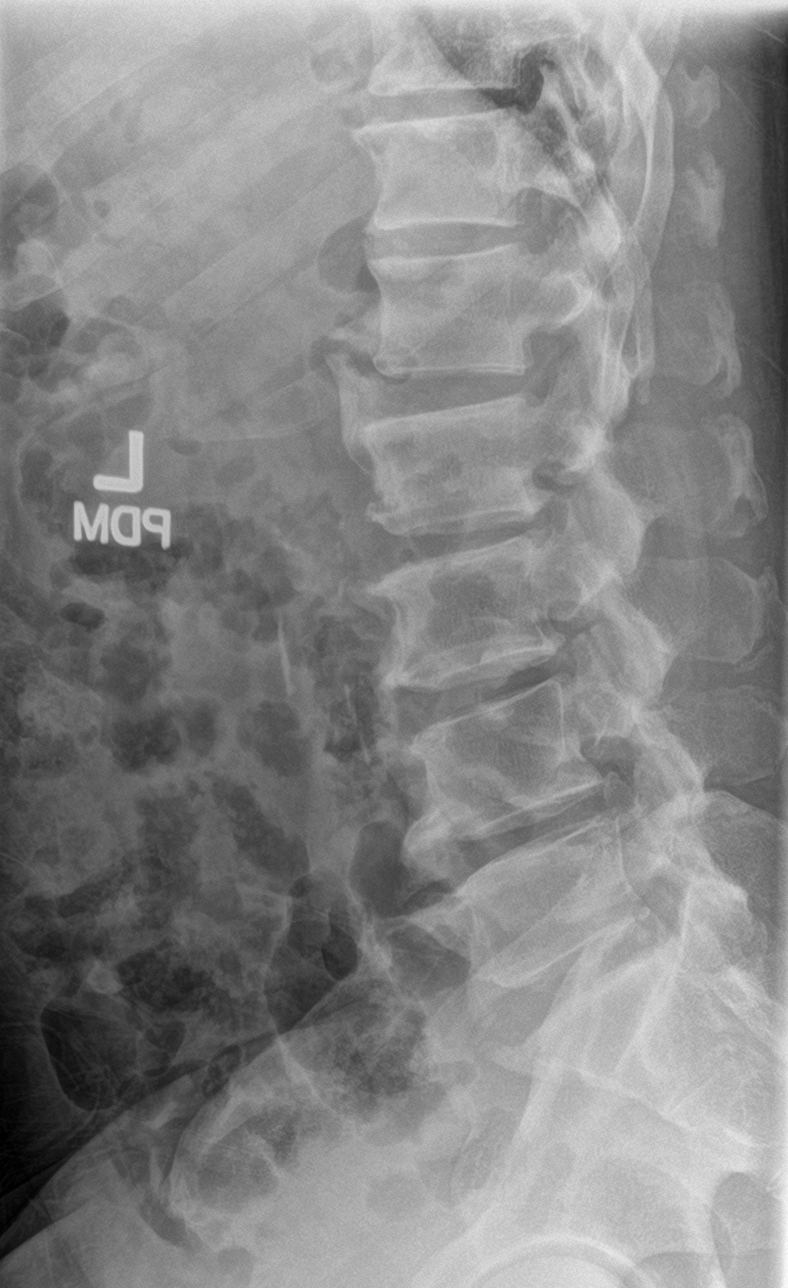

[l-spine spot]
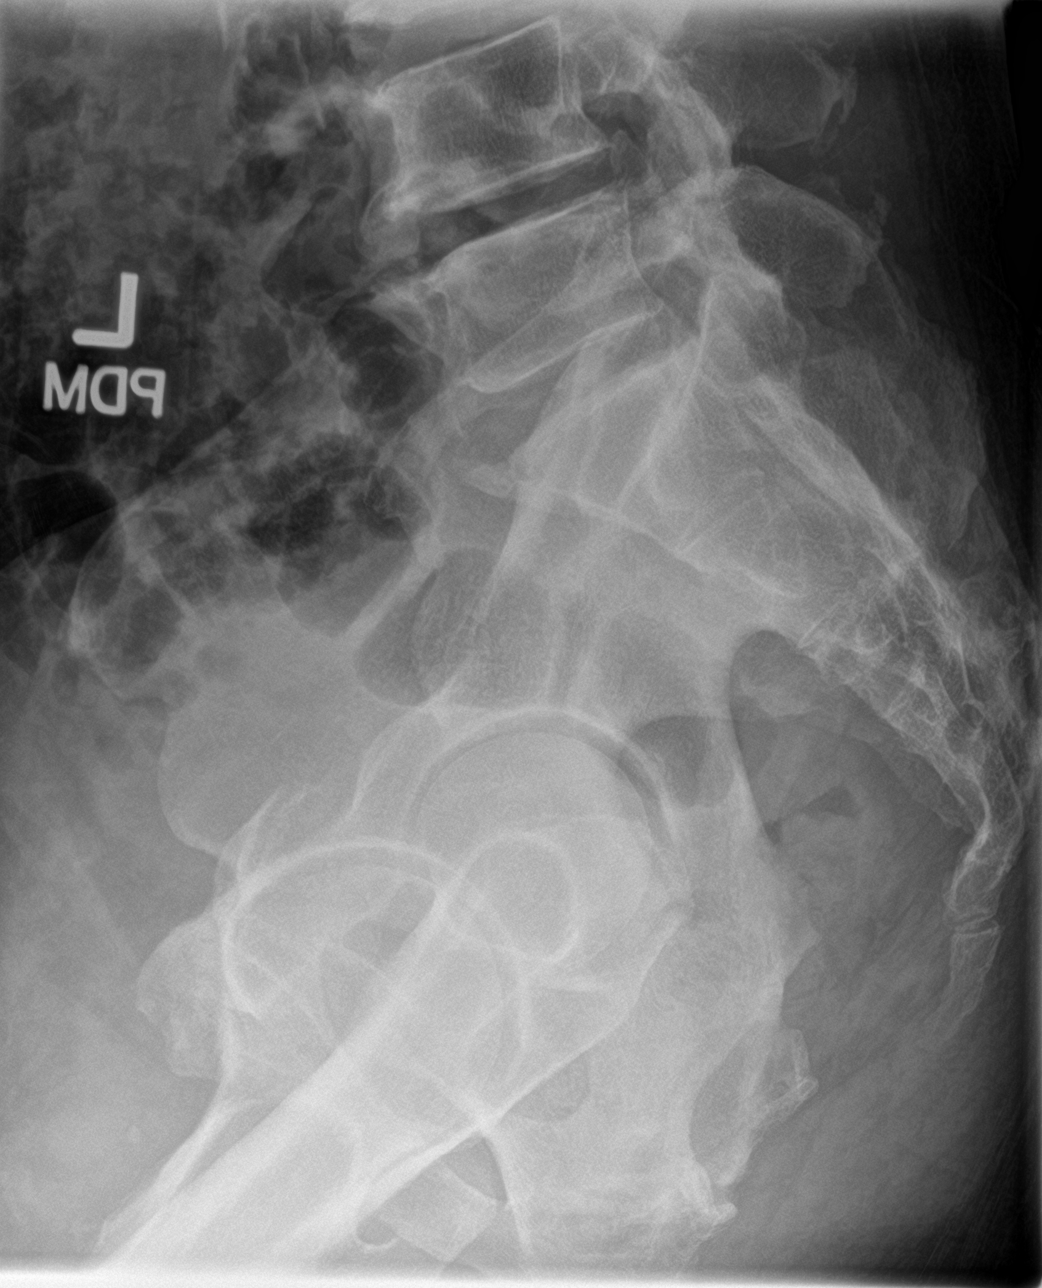

[5 of 5 positions shown; findings below may reference images not displayed]

FINDINGS: Diffuse spondylosis with bulky spurring. Mild generalized disc
narrowing. No notable facet arthropathy. Normal spinal alignment. No
fracture deformity or endplate erosion. No evidence of bone lesion.
IMPRESSION: Generalized spondylosis and mild disc narrowing. No acute finding or
change from 5643 CT.
